# Patient Record
Sex: Male | Born: 1965
Health system: Southern US, Community
[De-identification: ages and names within clinical notes are randomized; demographics above are authoritative.]

## PROBLEM LIST (undated history)

## (undated) DIAGNOSIS — G4733 Obstructive sleep apnea (adult) (pediatric): Secondary | ICD-10-CM

## (undated) DIAGNOSIS — L738 Other specified follicular disorders: Secondary | ICD-10-CM

## (undated) DIAGNOSIS — G4730 Sleep apnea, unspecified: Secondary | ICD-10-CM

## (undated) DIAGNOSIS — G43909 Migraine, unspecified, not intractable, without status migrainosus: Secondary | ICD-10-CM

## (undated) DIAGNOSIS — I10 Essential (primary) hypertension: Secondary | ICD-10-CM

## (undated) HISTORY — DX: Other specified follicular disorders: L73.8

## (undated) HISTORY — DX: Essential (primary) hypertension: I10

## (undated) HISTORY — DX: Migraine, unspecified, not intractable, without status migrainosus: G43.909

## (undated) HISTORY — DX: Sleep apnea, unspecified: G47.30

## (undated) HISTORY — DX: Obstructive sleep apnea (adult) (pediatric): G47.33

---

## 1998-08-31 HISTORY — PX: OTHER SURGICAL HISTORY: SHX169

## 1998-09-25 ENCOUNTER — Inpatient Hospital Stay (HOSPITAL_COMMUNITY): Admission: RE | Admit: 1998-09-25 | Discharge: 1998-09-28 | Payer: Self-pay | Admitting: Oral Surgery

## 1998-09-26 ENCOUNTER — Encounter: Payer: Self-pay | Admitting: Oral Surgery

## 2007-03-28 ENCOUNTER — Emergency Department (HOSPITAL_COMMUNITY): Admission: EM | Admit: 2007-03-28 | Discharge: 2007-03-28 | Payer: Self-pay | Admitting: Emergency Medicine

## 2012-03-23 HISTORY — PX: FOOT SURGERY: SHX648

## 2013-02-12 ENCOUNTER — Ambulatory Visit (INDEPENDENT_AMBULATORY_CARE_PROVIDER_SITE_OTHER): Payer: BC Managed Care – PPO | Admitting: Podiatry

## 2013-02-12 ENCOUNTER — Ambulatory Visit (INDEPENDENT_AMBULATORY_CARE_PROVIDER_SITE_OTHER): Payer: BC Managed Care – PPO

## 2013-02-12 ENCOUNTER — Encounter: Payer: Self-pay | Admitting: Podiatry

## 2013-02-12 VITALS — BP 130/89 | HR 68 | Resp 16 | Ht 69.0 in | Wt 190.0 lb

## 2013-02-12 DIAGNOSIS — R52 Pain, unspecified: Secondary | ICD-10-CM

## 2013-02-12 DIAGNOSIS — S92309A Fracture of unspecified metatarsal bone(s), unspecified foot, initial encounter for closed fracture: Secondary | ICD-10-CM | POA: Insufficient documentation

## 2013-02-12 MED ORDER — OXYCODONE-ACETAMINOPHEN 10-325 MG PO TABS: ORAL_TABLET | ORAL | Status: DC

## 2013-02-12 NOTE — Progress Notes (Signed)
N HURT/SWELL L RIGHT FOOT THRU OUT D 1.5 WEEK O SUDDEN C WORSE A EVERYTHING T SOAK IN EPSON SALT, TIGHT SOCK,

## 2013-02-12 NOTE — Patient Instructions (Signed)
Wear cam walker at all times.

## 2013-02-13 NOTE — Progress Notes (Signed)
Lance Long presents today with a chief complaint of a painful swollen right foot times about week and a half. States that he dropped a Financial planner on his right foot a week and a half ago started swelling the next day has been painful ever since. Seems to be getting worse he says. He's been soaking in Epsom salts and wear a tight sock.  Objective: Pulses are palpable dorsalis pedis and tibial artery. He has overlying edema dorsal lateral aspect of the right foot with excoriations. No signs of infection. Tendo palpation to the base of the fourth and fifth metatarsals right. Radiographic evaluation does demonstrate a cortical interruption to the medial side of the fifth metatarsal right. It also demonstrates a dorsal edema.  Assessment: Fracture fourth fifth metatarsals left foot traumatic in nature.  Plan: I wrote a prescription for pain medication. I suggested he get into his cam boot which she has at home. I suggested he wear the boot as if it were a cast. I will followup with Lance Long in a few weeks. This time another set of x-rays will be performed.

## 2013-03-14 ENCOUNTER — Ambulatory Visit: Payer: BC Managed Care – PPO | Admitting: Podiatry

## 2013-03-19 ENCOUNTER — Ambulatory Visit: Payer: BC Managed Care – PPO | Admitting: Podiatry

## 2014-02-20 ENCOUNTER — Other Ambulatory Visit: Payer: Self-pay | Admitting: *Deleted

## 2014-02-20 DIAGNOSIS — R002 Palpitations: Secondary | ICD-10-CM

## 2014-02-26 ENCOUNTER — Encounter: Payer: Self-pay | Admitting: *Deleted

## 2014-02-26 ENCOUNTER — Ambulatory Visit (INDEPENDENT_AMBULATORY_CARE_PROVIDER_SITE_OTHER): Payer: Managed Care, Other (non HMO) | Admitting: Radiology

## 2014-02-26 DIAGNOSIS — R002 Palpitations: Secondary | ICD-10-CM

## 2014-02-26 NOTE — Progress Notes (Signed)
Patient ID: Lance Long, male   DOB: 04/08/1966, 48 y.o.   MRN: [id number] [facility] 48 hour holter monitor applied to patient.

## 2014-03-21 ENCOUNTER — Other Ambulatory Visit: Payer: Self-pay | Admitting: *Deleted

## 2014-03-21 ENCOUNTER — Encounter: Payer: Self-pay | Admitting: Cardiology

## 2014-03-21 ENCOUNTER — Ambulatory Visit (INDEPENDENT_AMBULATORY_CARE_PROVIDER_SITE_OTHER): Payer: Managed Care, Other (non HMO) | Admitting: Cardiology

## 2014-03-21 DIAGNOSIS — R079 Chest pain, unspecified: Secondary | ICD-10-CM

## 2014-03-21 DIAGNOSIS — R002 Palpitations: Secondary | ICD-10-CM

## 2014-03-21 NOTE — Progress Notes (Signed)
Exercise Treadmill Test  Pre-Exercise Testing Evaluation Rhythm: normal sinus  Rate: 71 bpm     Test  Exercise Tolerance Test Ordering MD: Dietrich PatesPaula Ross, MD  Interpreting MD: Robbie LisBrittainy Riane Rung, PA-C  Unique Test No: 1  Treadmill:  1  Indication for ETT: chest pain - rule out ischemia  Contraindication to ETT: No   Stress Modality: exercise - treadmill  Cardiac Imaging Performed: non   Protocol: standard Bruce - maximal  Max BP:  211/97  Max MPHR (bpm):  173 85% MPR (bpm):  147  MPHR obtained (bpm):  164 % MPHR obtained:  94%  Reached 85% MPHR (min:sec):  7:35 Total Exercise Time (min-sec):  10:00  Workload in METS:  11.7 Borg Scale: 15  Reason ETT Terminated:  desired heart rate attained    ST Segment Analysis At Rest: normal ST segments - no evidence of significant ST depression With Exercise: no evidence of significant ST depression  Other Information Arrhythmia:  No Angina during ETT:  absent (0) Quality of ETT:  diagnostic  ETT Interpretation:  normal - no evidence of ischemia by ST analysis  Comments: Dr. Tenny Crawoss to review EKGs.   Recommendations: Results pending.

## 2014-10-29 ENCOUNTER — Encounter: Payer: Self-pay | Admitting: Cardiology

## 2014-10-29 ENCOUNTER — Ambulatory Visit (INDEPENDENT_AMBULATORY_CARE_PROVIDER_SITE_OTHER): Payer: Managed Care, Other (non HMO) | Admitting: Cardiology

## 2014-10-29 VITALS — BP 120/80 | HR 69 | Ht 69.0 in | Wt 211.2 lb

## 2014-10-29 DIAGNOSIS — I10 Essential (primary) hypertension: Secondary | ICD-10-CM | POA: Diagnosis not present

## 2014-10-29 DIAGNOSIS — G4733 Obstructive sleep apnea (adult) (pediatric): Secondary | ICD-10-CM | POA: Diagnosis not present

## 2014-10-29 DIAGNOSIS — E669 Obesity, unspecified: Secondary | ICD-10-CM | POA: Diagnosis not present

## 2014-10-29 NOTE — Addendum Note (Signed)
Addended by: Gunnar FusiKEMP, KATHRYN A on: 10/29/2014 12:14 PM   Modules accepted: Orders

## 2014-10-29 NOTE — Patient Instructions (Signed)
Medication Instructions:  Your physician recommends that you continue on your current medications as directed. Please refer to the Current Medication list given to you today.   Labwork: None    Testing/Procedures: None  Follow-Up: Your physician wants you to follow-up in: 6 months with Dr. Mayford Knifeurner. You will receive a reminder letter in the mail two months in advance. If you don't receive a letter, please call our office to schedule the follow-up appointment.   Any Other Special Instructions Will Be Listed Below (If Applicable). [facility] will be in touch with you soon to get your new mask and supplies.

## 2014-10-29 NOTE — Progress Notes (Signed)
Cardiology Office Note   Date:  10/29/2014   ID:  Lance Long, DOB [birth year], MRN [id number]  PCP:  REDMON,NOELLE, PA-C    Chief Complaint  Patient presents with  . New Evaluation    sleep apnea      History of Present Illness: Lance Long is a 49 y.o. male who presents for evaluation of OSA.  He has a history of OSA dating back to 2010 but has not followed up.  He was started on CPAP at 11cm H2O. He has a history of HTN as well.  He tolerates his CPAP device but over the past few months he  has stopped using it due to needing a new mask and supplies.   He tolerates the mask but says that he needs a new mask.  He has used the full face mask in the past.   He feels the pressure is adequate.  He feels rested in the am and has no daytime sleepiness.  He does not snore.  He has no mouth dryness or congestion. He has not had any supplies in a while and needs some new ones.      Past Medical History  Diagnosis Date  . Hypertension   . OSA (obstructive sleep apnea)   . Migraine   . Folliculitis barbae     Past Surgical History  Procedure Laterality Date  . Tmj jaw    . Foot surgery Left 11.22.13     Current Outpatient Prescriptions  Medication Sig Dispense Refill  . fexofenadine-pseudoephedrine (ALLEGRA-D 24) 180-240 MG per 24 hr tablet Take 1 tablet by mouth as needed.    . hydrochlorothiazide (HYDRODIURIL) 25 MG tablet Take 25 mg by mouth daily.    . Multiple Vitamin (MULTI VITAMIN DAILY) TABS Take by mouth daily.    . SUMAtriptan (IMITREX) 50 MG tablet Take 50 mg by mouth every 2 (two) hours as needed for migraine. May repeat in 2 hours if headache persists or recurs.     No current facility-administered medications for this visit.    Allergies:   Oxycodone-acetaminophen    Social History:  The patient  reports that he has never smoked. He has never used smokeless tobacco. He reports that he drinks alcohol. He reports that he does not use  illicit drugs.   Family History:  The patient's family history includes Fibromyalgia in his mother; Heart attack in his maternal grandmother; Hypertension in his mother; Liver cancer in his father; Liver disease in his maternal grandfather.    ROS:  Please see the history of present illness.   Otherwise, review of systems are positive for none.   All other systems are reviewed and negative.   PHYSICAL EXAM: VS:  BP 120/80 mmHg  Pulse 69  Ht  (1.753 m)  Wt 211 lb 3.2 oz (95.8 kg)  BMI 31.17 kg/m2  SpO2 98% , BMI Body mass index is 31.17 kg/(m^2). GEN: Well nourished, well developed, in no acute distress HEENT: normal Neck: no JVD, carotid bruits, or masses Cardiac: RRR; no murmurs, rubs, or gallops,no edema  Respiratory:  clear to auscultation bilaterally, normal work of breathing GI: soft, nontender, nondistended, + BS MS: no deformity or atrophy Skin: warm and dry, no rash Neuro:  Strength and sensation are intact Psych: euthymic mood, full affect   EKG:  EKG is not ordered today.    Recent Labs: No results  found for requested labs within last 365 days.    Lipid Panel No results found for: CHOL, TRIG, HDL, CHOLHDL, VLDL, LDLCALC, LDLDIRECT    Wt Readings from Last 3 Encounters:  10/29/14 211 lb 3.2 oz (95.8 kg)  02/12/13 190 lb (86.183 kg)        ASSESSMENT AND PLAN:  1.  OSA on CPAP and tolerating well but needs new supplies which I will order.  I have also instructed the patient on proper sleep hygiene, avoidance of sleeping in the supine position and avoidance of alcohol within 4 hours of bedtime.  The patient was also instructed to avoid driving if sleepy.   2.  HTN - controlled on HCTZ 3.  Obesity - he has started back exercising with cardio and weight at home.    Current medicines are reviewed at length with the patient today.  The patient does not have concerns regarding medicines.  The following changes have been made:  no change  Labs/ tests  ordered today: See above Assessment and Plan No orders of the defined types were placed in this encounter.     Disposition:   FU with me in 6 months  Signed, Quintella ReichertURNER,Damiya Sandefur R, MD  10/29/2014 11:35 AM    [facility] [address], [place], [place]  [address] Phone: [phone]; Fax: [phone]

## 2015-10-20 ENCOUNTER — Ambulatory Visit: Payer: Managed Care, Other (non HMO) | Admitting: Family

## 2017-11-21 ENCOUNTER — Ambulatory Visit: Payer: Managed Care, Other (non HMO) | Admitting: Adult Health

## 2017-11-21 NOTE — Progress Notes (Deleted)
   Subjective:    Patient ID: Lance Long, male    DOB: 12/14/1965, 52 y.o.   MRN: [id number]  HPI:  Mr. Lance Long is here to establish as a new pt.  He is a pleasant 52 year old male. PMH:  HTN, Migraine, Obesity,    Patient Care Team    Relationship Specialty Notifications Start End  Milus Heightedmon, Noelle, [place] PCP - General Nurse Practitioner  02/12/13     Patient Active Problem List   Diagnosis Date Noted  . OSA (obstructive sleep apnea) 10/29/2014  . Benign essential HTN 10/29/2014  . Obesity (BMI 30-39.9) 10/29/2014  . Closed fracture of metatarsal bone(s) 02/12/2013     Past Medical History:  Diagnosis Date  . Folliculitis barbae   . Hypertension   . Migraine   . OSA (obstructive sleep apnea)      Past Surgical History:  Procedure Laterality Date  . FOOT SURGERY Left 11.22.13  . TMJ JAW       Family History  Problem Relation Age of Onset  . Hypertension Mother   . Fibromyalgia Mother   . Liver cancer Father   . Heart attack Maternal Grandmother   . Liver disease Maternal Grandfather      Social History   Substance and Sexual Activity  Drug Use No     Social History   Substance and Sexual Activity  Alcohol Use Yes   Comment: OCCASIONALLY     Social History   Tobacco Use  Smoking Status Never Smoker  Smokeless Tobacco Never Used     Outpatient Encounter Medications as of 11/21/2017  Medication Sig  . fexofenadine-pseudoephedrine (ALLEGRA-D 24) 180-240 MG per 24 hr tablet Take 1 tablet by mouth as needed.  . hydrochlorothiazide (HYDRODIURIL) 25 MG tablet Take 25 mg by mouth daily.  . Multiple Vitamin (MULTI VITAMIN DAILY) TABS Take by mouth daily.  . SUMAtriptan (IMITREX) 50 MG tablet Take 50 mg by mouth every 2 (two) hours as needed for migraine. May repeat in 2 hours if headache persists or recurs.   No facility-administered encounter medications on file as of 11/21/2017.     Allergies: Oxycodone-acetaminophen  There is no height or  weight on file to calculate BMI.  There were no vitals taken for this visit.   Review of Systems     Objective:   Physical Exam        Assessment & Plan:

## 2017-12-19 ENCOUNTER — Ambulatory Visit (INDEPENDENT_AMBULATORY_CARE_PROVIDER_SITE_OTHER): Payer: Managed Care, Other (non HMO) | Admitting: Adult Health

## 2017-12-19 ENCOUNTER — Encounter: Payer: Self-pay | Admitting: Adult Health

## 2017-12-19 VITALS — BP 143/83 | HR 65 | Ht 69.0 in | Wt 194.6 lb

## 2017-12-19 DIAGNOSIS — Z23 Encounter for immunization: Secondary | ICD-10-CM

## 2017-12-19 DIAGNOSIS — Z1159 Encounter for screening for other viral diseases: Secondary | ICD-10-CM | POA: Diagnosis not present

## 2017-12-19 DIAGNOSIS — Z833 Family history of diabetes mellitus: Secondary | ICD-10-CM | POA: Diagnosis not present

## 2017-12-19 DIAGNOSIS — G43901 Migraine, unspecified, not intractable, with status migrainosus: Secondary | ICD-10-CM

## 2017-12-19 DIAGNOSIS — G4733 Obstructive sleep apnea (adult) (pediatric): Secondary | ICD-10-CM

## 2017-12-19 DIAGNOSIS — G43909 Migraine, unspecified, not intractable, without status migrainosus: Secondary | ICD-10-CM | POA: Insufficient documentation

## 2017-12-19 DIAGNOSIS — Z114 Encounter for screening for human immunodeficiency virus [HIV]: Secondary | ICD-10-CM

## 2017-12-19 DIAGNOSIS — Z0000 Encounter for general adult medical examination without abnormal findings: Secondary | ICD-10-CM | POA: Diagnosis not present

## 2017-12-19 DIAGNOSIS — G43101 Migraine with aura, not intractable, with status migrainosus: Secondary | ICD-10-CM

## 2017-12-19 DIAGNOSIS — Z1211 Encounter for screening for malignant neoplasm of colon: Secondary | ICD-10-CM

## 2017-12-19 DIAGNOSIS — I10 Essential (primary) hypertension: Secondary | ICD-10-CM

## 2017-12-19 DIAGNOSIS — G4730 Sleep apnea, unspecified: Secondary | ICD-10-CM

## 2017-12-19 MED ORDER — HYDROCHLOROTHIAZIDE 25 MG PO TABS: 25.0000 mg | ORAL_TABLET | Freq: Every day | ORAL | 3 refills | Status: DC

## 2017-12-19 MED ORDER — SUMATRIPTAN SUCCINATE 50 MG PO TABS: 50.0000 mg | ORAL_TABLET | ORAL | 2 refills | Status: DC | PRN

## 2017-12-19 NOTE — Assessment & Plan Note (Signed)
He has not used CPAP >3 years due to machine malfunctioning Referral to Pulmonology placed

## 2017-12-19 NOTE — Assessment & Plan Note (Addendum)
BP above goal  143/83, HR 62 Currently on HCTZ 25mg  QD If elevated at f/u with consider adding CCB

## 2017-12-19 NOTE — Assessment & Plan Note (Signed)
2 migraines/week Will experience aura and light/sound sensitivity Referral to Neurology placed

## 2017-12-19 NOTE — Progress Notes (Addendum)
Subjective:    Patient ID: Lance Long, male    DOB: 09/16/1965, 52 y.o.   MRN: [id number]  HPI :  [name] is here to establish as a new pt.  He is a pleasant 52 year old male. PMH:  HTN, Migraines with aura He has been on HCTZ 25mg  QD for >15 years He was initially dx'd with migraine at age 52 (11 years ago).  He would experience  1-3 migraines/month and then they eventually reduced to once/month. The last 4 weeks he reports HAs have increased to 2/week- will last all day and cause nausea w/o vomiting. He reports pain will "be on the top of my head", described as aching and rates 8/10. He previously used Imitrex, however has been out "for quite awhile" He has been using OTC NSAIDs with only minimal sx relief. He estimates to drink >gallon water/day and eats only one small meal/day His wife of 27 years passed away April 2019 He has one 52 yr old daughter "Lance Long" that is living with him He is not in therapy, declined CBT referral He reports strong support system of family/friends and denies acute depression He denies tobacco use and seldom drinks ETOH He works three jobs, averaging >95 hrs/week He exercises- cardio at least once week for 30 minutes  Patient Care Team    Relationship Specialty Notifications Start End  Jru Pense, Jinny BlossomKaty D, NP PCP - General Family Medicine  12/19/17     Patient Active Problem List   Diagnosis Date Noted  . Acute pain of right knee 11/14/2018  . Leg numbness 06/13/2018  . Leg weakness, bilateral 06/13/2018  . Erectile dysfunction 02/26/2018  . Chronic migraine 02/06/2018  . Obstructive sleep apnea 02/06/2018  . Migraine 12/19/2017  . Healthcare maintenance 12/19/2017  . OSA (obstructive sleep apnea) 10/29/2014  . Benign essential HTN 10/29/2014  . Obesity (BMI 30-39.9) 10/29/2014  . Closed fracture of metatarsal bone(s) 02/12/2013     Past Medical History:  Diagnosis Date  . Folliculitis barbae   . Hypertension   . Migraine   . OSA  (obstructive sleep apnea)   . Sleep apnea    does not use c-pap machine     Past Surgical History:  Procedure Laterality Date  . FOOT SURGERY Left 11.22.13  . TMJ JAW  08/1998     Family History  Problem Relation Age of Onset  . Hypertension Mother   . Fibromyalgia Mother   . Diabetes Mother   . Liver cancer Father   . Heart attack Maternal Grandmother   . Diabetes Maternal Grandmother   . Hypertension Maternal Grandmother   . Stroke Maternal Grandmother   . Liver disease Maternal Grandfather   . Healthy Sister   . Healthy Brother   . Colon cancer Neg Hx   . Colon polyps Neg Hx   . Esophageal cancer Neg Hx   . Rectal cancer Neg Hx   . Stomach cancer Neg Hx      Social History   Substance and Sexual Activity  Drug Use No     Social History   Substance and Sexual Activity  Alcohol Use Yes  . Alcohol/week: 2.0 standard drinks  . Types: 2 Glasses of wine per week   Comment: occasional     Social History   Tobacco Use  Smoking Status Never Smoker  Smokeless Tobacco Never Used     Outpatient Encounter Medications as of 12/19/2017  Medication Sig  . Multiple Vitamin (MULTI VITAMIN DAILY) TABS  Take by mouth daily.  . [DISCONTINUED] hydrochlorothiazide (HYDRODIURIL) 25 MG tablet Take 25 mg by mouth daily.  . [DISCONTINUED] hydrochlorothiazide (HYDRODIURIL) 25 MG tablet Take 1 tablet (25 mg total) by mouth daily.  . [DISCONTINUED] fexofenadine-pseudoephedrine (ALLEGRA-D 24) 180-240 MG per 24 hr tablet Take 1 tablet by mouth as needed.  . [DISCONTINUED] SUMAtriptan (IMITREX) 50 MG tablet Take 50 mg by mouth every 2 (two) hours as needed for migraine. May repeat in 2 hours if headache persists or recurs.  . [DISCONTINUED] SUMAtriptan (IMITREX) 50 MG tablet Take 1 tablet (50 mg total) by mouth every 2 (two) hours as needed for migraine. May repeat in 2 hours if headache persists or recurs.   No facility-administered encounter medications on file as of 12/19/2017.      Allergies: Patient has no known allergies.  Body mass index is 28.74 kg/m.  Blood pressure (!) 143/83, pulse 65, height 5\' 9"  (1.753 m), weight 194 lb 9.6 oz (88.3 kg), SpO2 100 %.  Review of Systems  Constitutional: Positive for fatigue. Negative for activity change, appetite change, chills, diaphoresis, fever and unexpected weight change.  Eyes: Negative for visual disturbance.  Respiratory: Negative for cough, chest tightness, shortness of breath, wheezing and stridor.   Cardiovascular: Negative for chest pain, palpitations and leg swelling.  Gastrointestinal: Positive for nausea. Negative for abdominal distention, abdominal pain, blood in stool, constipation, diarrhea and vomiting.  Genitourinary: Negative for difficulty urinating and flank pain.  Musculoskeletal: Negative for arthralgias, back pain, gait problem, joint swelling, myalgias, neck pain and neck stiffness.  Skin: Negative for color change, pallor, rash and wound.  Neurological: Positive for headaches. Negative for dizziness.  Hematological: Does not bruise/bleed easily.  Psychiatric/Behavioral: Positive for sleep disturbance. Negative for confusion, decreased concentration, dysphoric mood, hallucinations, self-injury and suicidal ideas. The patient is not nervous/anxious and is not hyperactive.        Objective:   Physical Exam  Constitutional: He is oriented to person, place, and time. He appears well-developed and well-nourished. No distress.  HENT:  Head: Normocephalic and atraumatic.  Right Ear: External ear normal.  Nose: Nose normal.  Mouth/Throat: Oropharynx is clear and moist.  Cardiovascular: Normal rate, regular rhythm, normal heart sounds and intact distal pulses.  No murmur heard. Pulmonary/Chest: Effort normal and breath sounds normal. No stridor. No respiratory distress. He has no wheezes. He has no rales. He exhibits no tenderness.  Neurological: He is alert and oriented to person, place, and  time.  Skin: Skin is warm and dry. Capillary refill takes less than 2 seconds. No rash noted. He is not diaphoretic. No erythema. No pallor.  Psychiatric: He has a normal mood and affect. His behavior is normal. Judgment and thought content normal.  Nursing note and vitals reviewed.     Assessment & Plan:   1. Healthcare maintenance   2. Family history of diabetes mellitus in mother   3. Screening for HIV (human immunodeficiency virus)   4. Encounter for hepatitis C screening test for low risk patient   5. Need for zoster vaccination   6. Screening for colon cancer   7. Sleep apnea, unspecified type   8. Migraine with aura and with status migrainosus, not intractable   9. Benign essential HTN   10. Migraine with status migrainosus, not intractable, unspecified migraine type   11. OSA (obstructive sleep apnea)     Benign essential HTN BP above goal  143/83, HR 62 Currently on HCTZ 25mg  QD If elevated at f/u with consider  adding CCB  Migraine 2 migraines/week Will experience aura and light/sound sensitivity Referral to Neurology placed  OSA (obstructive sleep apnea) He has not used CPAP >3 years due to machine malfunctioning Referral to Pulmonology placed  Healthcare maintenance Continue all medications as directed. Rx refills sent in. Referral to Pulmonology placed, re: Sleep Study Referral to Neurology placed, re: migraine with aura- increasing in frequency and intensity. Follow-up in 3 months for complete physical with fasting labs    FOLLOW-UP:  Return in about 3 months (around 03/21/2018) for CPE, Fasting Labs.

## 2017-12-19 NOTE — Assessment & Plan Note (Signed)
Continue all medications as directed. Rx refills sent in. Referral to Pulmonology placed, re: Sleep Study Referral to Neurology placed, re: migraine with aura- increasing in frequency and intensity. Follow-up in 3 months for complete physical with fasting labs

## 2017-12-19 NOTE — Patient Instructions (Addendum)
Mediterranean Diet A Mediterranean diet refers to food and lifestyle choices that are based on the traditions of countries located on the [place]. This way of eating has been shown to help prevent certain conditions and improve outcomes for people who have chronic diseases, like kidney disease and heart disease. What are tips for following this plan? Lifestyle  Cook and eat meals together with your family, when possible.  Drink enough fluid to keep your urine clear or pale yellow.  Be physically active every day. This includes: ? Aerobic exercise like running or swimming. ? Leisure activities like gardening, walking, or housework.  Get 7-8 hours of sleep each night.  If recommended by your health care provider, drink red wine in moderation. This means 1 glass a day for nonpregnant women and 2 glasses a day for men. A glass of wine equals 5 oz (150 mL). Reading food labels  Check the serving size of packaged foods. For foods such as rice and pasta, the serving size refers to the amount of cooked product, not dry.  Check the total fat in packaged foods. Avoid foods that have saturated fat or trans fats.  Check the ingredients list for added sugars, such as corn syrup. Shopping  At the grocery store, buy most of your food from the areas near the walls of the store. This includes: ? Fresh fruits and vegetables (produce). ? Grains, beans, nuts, and seeds. Some of these may be available in unpackaged forms or large amounts (in bulk). ? Fresh seafood. ? Poultry and eggs. ? Low-fat dairy products.  Buy whole ingredients instead of prepackaged foods.  Buy fresh fruits and vegetables in-season from local farmers markets.  Buy frozen fruits and vegetables in resealable bags.  If you do not have access to quality fresh seafood, buy precooked frozen shrimp or canned fish, such as tuna, salmon, or sardines.  Buy small amounts of raw or cooked vegetables, salads, or olives from the  deli or salad bar at your store.  Stock your pantry so you always have certain foods on hand, such as olive oil, canned tuna, canned tomatoes, rice, pasta, and beans. Cooking  Cook foods with extra-virgin olive oil instead of using butter or other vegetable oils.  Have meat as a side dish, and have vegetables or grains as your main dish. This means having meat in small portions or adding small amounts of meat to foods like pasta or stew.  Use beans or vegetables instead of meat in common dishes like chili or lasagna.  Experiment with different cooking methods. Try roasting or broiling vegetables instead of steaming or sauteing them.  Add frozen vegetables to soups, stews, pasta, or rice.  Add nuts or seeds for added healthy fat at each meal. You can add these to yogurt, salads, or vegetable dishes.  Marinate fish or vegetables using olive oil, lemon juice, garlic, and fresh herbs. Meal planning  Plan to eat 1 vegetarian meal one day each week. Try to work up to 2 vegetarian meals, if possible.  Eat seafood 2 or more times a week.  Have healthy snacks readily available, such as: ? Vegetable sticks with hummus. ? Greek yogurt. ? Fruit and nut trail mix.  Eat balanced meals throughout the week. This includes: ? Fruit: 2-3 servings a day ? Vegetables: 4-5 servings a day ? Low-fat dairy: 2 servings a day ? Fish, poultry, or lean meat: 1 serving a day ? Beans and legumes: 2 or more servings a week ? Nuts   and seeds: 1-2 servings a day ? Whole grains: 6-8 servings a day ? Extra-virgin olive oil: 3-4 servings a day  Limit red meat and sweets to only a few servings a month What are my food choices?  Mediterranean diet ? Recommended ? Grains: Whole-grain pasta. Brown rice. Bulgar wheat. Polenta. Couscous. Whole-wheat bread. [place]. ? Vegetables: Artichokes. Beets. Broccoli. Cabbage. Carrots. Eggplant. Green beans. Chard. Kale. Spinach. Onions. Leeks. Peas. Squash.  Tomatoes. Peppers. Radishes. ? Fruits: Apples. Apricots. Avocado. Berries. Bananas. Cherries. Dates. Figs. Grapes. Lemons. Melon. Oranges. Peaches. Plums. Pomegranate. ? Meats and other protein foods: Beans. Almonds. Sunflower seeds. Pine nuts. Peanuts. [place]. Salmon. Scallops. Shrimp. [place]. Tilapia. Clams. Oysters. Eggs. ? Dairy: Low-fat milk. Cheese. Greek yogurt. ? Beverages: Water. Red wine. Herbal tea. ? Fats and oils: Extra virgin olive oil. Avocado oil. Grape seed oil. ? Sweets and desserts: [place] yogurt with honey. Baked apples. Poached pears. Trail mix. ? Seasoning and other foods: Basil. Cilantro. Coriander. Cumin. Mint. Parsley. Sage. Rosemary. Tarragon. Garlic. Oregano. Thyme. Pepper. Balsalmic vinegar. Tahini. Hummus. Tomato sauce. Olives. Mushrooms. ? Limit these ? Grains: Prepackaged pasta or rice dishes. Prepackaged cereal with added sugar. ? Vegetables: Deep fried potatoes (french fries). ? Fruits: Fruit canned in syrup. ? Meats and other protein foods: Beef. Pork. Lamb. Poultry with skin. Hot dogs. [place] Salines. ? Dairy: Ice cream. Sour cream. Whole milk. ? Beverages: Juice. Sugar-sweetened soft drinks. Beer. Liquor and spirits. ? Fats and oils: Butter. Canola oil. Vegetable oil. Beef fat (tallow). Lard. ? Sweets and desserts: Cookies. Cakes. Pies. Candy. ? Seasoning and other foods: Mayonnaise. Premade sauces and marinades. ? The items listed may not be a complete list. Talk with your dietitian about what dietary choices are right for you. Summary  The Mediterranean diet includes both food and lifestyle choices.  Eat a variety of fresh fruits and vegetables, beans, nuts, seeds, and whole grains.  Limit the amount of red meat and sweets that you eat.  Talk with your health care provider about whether it is safe for you to drink red wine in moderation. This means 1 glass a day for nonpregnant women and 2 glasses a day for men. A glass of wine equals 5 oz (150 mL). This information  is not intended to replace advice given to you by your health care provider. Make sure you discuss any questions you have with your health care provider. Document Released: 12/10/2015 Document Revised: 01/12/2016 Document Reviewed: 12/10/2015 Elsevier Interactive Patient Education  2018 [employer].   Hypertension Hypertension, commonly called high blood pressure, is when the force of blood pumping through the arteries is too strong. The arteries are the blood vessels that carry blood from the heart throughout the body. Hypertension forces the heart to work harder to pump blood and may cause arteries to become narrow or stiff. Having untreated or uncontrolled hypertension can cause heart attacks, strokes, kidney disease, and other problems. A blood pressure reading consists of a higher number over a lower number. Ideally, your blood pressure should be below 120/80. The first ("top") number is called the systolic pressure. It is a measure of the pressure in your arteries as your heart beats. The second ("bottom") number is called the diastolic pressure. It is a measure of the pressure in your arteries as the heart relaxes. What are the causes? The cause of this condition is not known. What increases the risk? Some risk factors for high blood pressure are under your control. Others are not. Factors you can  change  Smoking.  Having type 2 diabetes mellitus, high cholesterol, or both.  Not getting enough exercise or physical activity.  Being overweight.  Having too much fat, sugar, calories, or salt (sodium) in your diet.  Drinking too much alcohol. Factors that are difficult or impossible to change  Having chronic kidney disease.  Having a family history of high blood pressure.  Age. Risk increases with age.  Race. You may be at higher risk if you are African-American.  Gender. Men are at higher risk than women before age 70. After age 39, women are at higher risk than  men.  Having obstructive sleep apnea.  Stress. What are the signs or symptoms? Extremely high blood pressure (hypertensive crisis) may cause:  Headache.  Anxiety.  Shortness of breath.  Nosebleed.  Nausea and vomiting.  Severe chest pain.  Jerky movements you cannot control (seizures).  How is this diagnosed? This condition is diagnosed by measuring your blood pressure while you are seated, with your arm resting on a surface. The cuff of the blood pressure monitor will be placed directly against the skin of your upper arm at the level of your heart. It should be measured at least twice using the same arm. Certain conditions can cause a difference in blood pressure between your right and left arms. Certain factors can cause blood pressure readings to be lower or higher than normal (elevated) for a short period of time:  When your blood pressure is higher when you are in a health care provider's office than when you are at home, this is called white coat hypertension. Most people with this condition do not need medicines.  When your blood pressure is higher at home than when you are in a health care provider's office, this is called masked hypertension. Most people with this condition may need medicines to control blood pressure.  If you have a high blood pressure reading during one visit or you have normal blood pressure with other risk factors:  You may be asked to return on a different day to have your blood pressure checked again.  You may be asked to monitor your blood pressure at home for 1 week or longer.  If you are diagnosed with hypertension, you may have other blood or imaging tests to help your health care provider understand your overall risk for other conditions. How is this treated? This condition is treated by making healthy lifestyle changes, such as eating healthy foods, exercising more, and reducing your alcohol intake. Your health care provider may prescribe  medicine if lifestyle changes are not enough to get your blood pressure under control, and if:  Your systolic blood pressure is above 130.  Your diastolic blood pressure is above 80.  Your personal target blood pressure may vary depending on your medical conditions, your age, and other factors. Follow these instructions at home: Eating and drinking  Eat a diet that is high in fiber and potassium, and low in sodium, added sugar, and fat. An example eating plan is called the DASH (Dietary Approaches to Stop Hypertension) diet. To eat this way: ? Eat plenty of fresh fruits and vegetables. Try to fill half of your plate at each meal with fruits and vegetables. ? Eat whole grains, such as whole wheat pasta, brown rice, or whole grain bread. Fill about one quarter of your plate with whole grains. ? Eat or drink low-fat dairy products, such as skim milk or low-fat yogurt. ? Avoid fatty cuts of meat, processed  or cured meats, and poultry with skin. Fill about one quarter of your plate with lean proteins, such as fish, chicken without skin, beans, eggs, and tofu. ? Avoid premade and processed foods. These tend to be higher in sodium, added sugar, and fat.  Reduce your daily sodium intake. Most people with hypertension should eat less than 1,500 mg of sodium a day.  Limit alcohol intake to no more than 1 drink a day for nonpregnant women and 2 drinks a day for men. One drink equals 12 oz of beer, 5 oz of wine, or 1 oz of hard liquor. Lifestyle  Work with your health care provider to maintain a healthy body weight or to lose weight. Ask what an ideal weight is for you.  Get at least 30 minutes of exercise that causes your heart to beat faster (aerobic exercise) most days of the week. Activities may include walking, swimming, or biking.  Include exercise to strengthen your muscles (resistance exercise), such as pilates or lifting weights, as part of your weekly exercise routine. Try to do these types  of exercises for 30 minutes at least 3 days a week.  Do not use any products that contain nicotine or tobacco, such as cigarettes and e-cigarettes. If you need help quitting, ask your health care provider.  Monitor your blood pressure at home as told by your health care provider.  Keep all follow-up visits as told by your health care provider. This is important. Medicines  Take over-the-counter and prescription medicines only as told by your health care provider. Follow directions carefully. Blood pressure medicines must be taken as prescribed.  Do not skip doses of blood pressure medicine. Doing this puts you at risk for problems and can make the medicine less effective.  Ask your health care provider about side effects or reactions to medicines that you should watch for. Contact a health care provider if:  You think you are having a reaction to a medicine you are taking.  You have headaches that keep coming back (recurring).  You feel dizzy.  You have swelling in your ankles.  You have trouble with your vision. Get help right away if:  You develop a severe headache or confusion.  You have unusual weakness or numbness.  You feel faint.  You have severe pain in your chest or abdomen.  You vomit repeatedly.  You have trouble breathing. Summary  Hypertension is when the force of blood pumping through your arteries is too strong. If this condition is not controlled, it may put you at risk for serious complications.  Your personal target blood pressure may vary depending on your medical conditions, your age, and other factors. For most people, a normal blood pressure is less than 120/80.  Hypertension is treated with lifestyle changes, medicines, or a combination of both. Lifestyle changes include weight loss, eating a healthy, low-sodium diet, exercising more, and limiting alcohol. This information is not intended to replace advice given to you by your health care provider.  Make sure you discuss any questions you have with your health care provider. Document Released: 04/18/2005 Document Revised: 03/16/2016 Document Reviewed: 03/16/2016 Elsevier Interactive Patient Education  2018 [employer]  Continue all medications as directed. Rx refills sent in. Referral to Pulmonology placed, re: Sleep Study Referral to Neurology placed, re: migraine with aura- increasing in frequency and intensity. Follow-up in 3 months for complete physical with fasting labs. WELCOME TO THE PRACTICE!

## 2017-12-21 ENCOUNTER — Encounter: Payer: Self-pay | Admitting: Gastroenterology

## 2017-12-25 ENCOUNTER — Telehealth: Payer: Self-pay | Admitting: *Deleted

## 2017-12-25 ENCOUNTER — Ambulatory Visit: Payer: Self-pay | Admitting: Neurology

## 2017-12-25 ENCOUNTER — Encounter: Payer: Self-pay | Admitting: Neurology

## 2017-12-25 NOTE — Telephone Encounter (Signed)
No showed new patient appointment. 

## 2017-12-28 DIAGNOSIS — Z0289 Encounter for other administrative examinations: Secondary | ICD-10-CM

## 2018-01-10 ENCOUNTER — Institutional Professional Consult (permissible substitution): Payer: Managed Care, Other (non HMO) | Admitting: Internal Medicine

## 2018-01-10 ENCOUNTER — Ambulatory Visit (INDEPENDENT_AMBULATORY_CARE_PROVIDER_SITE_OTHER): Payer: Managed Care, Other (non HMO) | Admitting: Pulmonary Disease

## 2018-01-10 ENCOUNTER — Encounter: Payer: Self-pay | Admitting: Pulmonary Disease

## 2018-01-10 VITALS — BP 124/80 | HR 77 | Ht 70.0 in | Wt 200.0 lb

## 2018-01-10 DIAGNOSIS — G4733 Obstructive sleep apnea (adult) (pediatric): Secondary | ICD-10-CM | POA: Diagnosis not present

## 2018-01-10 NOTE — Progress Notes (Signed)
Lance Long    [id number]    [birth year]  Primary Care Physician:Danford, Jinny Blossom, NP  Referring Physician: Julaine Fusi, NP [address] [place], [place] [address]  Chief complaint:  Daytime sleepiness, snoring with a history of obstructive sleep apnea  HPI:  Diagnosed with obstructive sleep apnea about 10 to 15 years ago Was using CPAP up until about 3 years ago Some recurrence of symptoms that he had before There is daytime sleepiness Nonrestorative sleep History of loud snoring Usually goes to bed about 12 AM, wakes up at 330 to go to work He will get another few hours of sleep between 1030 and 230 Denies chronic headaches Denies memory issues No issues with his concentration  Denies any respiratory complaints at present  Mother has sleep apnea  Pets: dog Occupation: No pertinent occupational history Exposures: No significant exposures Smoking history: Never smoker No pertinent travel history  Outpatient Encounter Medications as of 01/10/2018  Medication Sig  . hydrochlorothiazide (HYDRODIURIL) 25 MG tablet Take 1 tablet (25 mg total) by mouth daily.  . Multiple Vitamin (MULTI VITAMIN DAILY) TABS Take by mouth daily.  . SUMAtriptan (IMITREX) 50 MG tablet Take 1 tablet (50 mg total) by mouth every 2 (two) hours as needed for migraine. May repeat in 2 hours if headache persists or recurs.   No facility-administered encounter medications on file as of 01/10/2018.     Allergies as of 01/10/2018 - Review Complete 01/10/2018  Allergen Reaction Noted  . Oxycodone-acetaminophen Itching 10/28/2014    Past Medical History:  Diagnosis Date  . Folliculitis barbae   . Hypertension   . Migraine   . OSA (obstructive sleep apnea)     Past Surgical History:  Procedure Laterality Date  . FOOT SURGERY Left 11.22.13  . TMJ JAW      Family History  Problem Relation Age of Onset  . Hypertension Mother   . Fibromyalgia Mother   . Diabetes Mother   .  Liver cancer Father   . Heart attack Maternal Grandmother   . Diabetes Maternal Grandmother   . Hypertension Maternal Grandmother   . Stroke Maternal Grandmother   . Liver disease Maternal Grandfather     Social History   Socioeconomic History  . Marital status: Married    Spouse name: Not on file  . Number of children: Not on file  . Years of education: Not on file  . Highest education level: Not on file  Occupational History  . Not on file  Social Needs  . Financial resource strain: Not on file  . Food insecurity:    Worry: Not on file    Inability: Not on file  . Transportation needs:    Medical: Not on file    Non-medical: Not on file  Tobacco Use  . Smoking status: Never Smoker  . Smokeless tobacco: Never Used  Substance and Sexual Activity  . Alcohol use: Yes    Alcohol/week: 2.0 standard drinks    Types: 2 Glasses of wine per week  . Drug use: No  . Sexual activity: Not Currently  Lifestyle  . Physical activity:    Days per week: Not on file    Minutes per session: Not on file  . Stress: Not on file  Relationships  . Social connections:    Talks on phone: Not on file    Gets together: Not on file    Attends religious service: Not on file  Active member of club or organization: Not on file    Attends meetings of clubs or organizations: Not on file    Relationship status: Not on file  . Intimate partner violence:    Fear of current or ex partner: Not on file    Emotionally abused: Not on file    Physically abused: Not on file    Forced sexual activity: Not on file  Other Topics Concern  . Not on file  Social History Narrative  . Not on file    Review of Systems  Constitutional: Positive for fatigue.  Respiratory: Positive for apnea.   Cardiovascular: Negative.   Gastrointestinal: Negative.   Genitourinary: Negative.   Skin: Negative.   Psychiatric/Behavioral: Positive for sleep disturbance.    There were no vitals filed for this  visit.   Physical Exam  Constitutional: He is oriented to person, place, and time. He appears well-developed and well-nourished.  HENT:  Mallampati 4  Eyes: Pupils are equal, round, and reactive to light. Conjunctivae and EOM are normal. Right eye exhibits no discharge.  Neck: Normal range of motion. Neck supple. No tracheal deviation present. No thyromegaly present.  Cardiovascular: Normal rate and regular rhythm.  Pulmonary/Chest: Effort normal and breath sounds normal. No respiratory distress.  Abdominal: Soft. Bowel sounds are normal. He exhibits no distension. There is no tenderness.  Musculoskeletal: Normal range of motion. He exhibits deformity. He exhibits no edema.  Neurological: He is alert and oriented to person, place, and time. He has normal reflexes. Coordination normal.  Skin: Skin is warm and dry. No rash noted. No erythema.  Psychiatric: He has a normal mood and affect.    Data Reviewed: Previous studies not available  Assessment:  High probability of significant sleep disordered breathing Excessive daytime sleepiness Loud snoring  Plan/Recommendations:  We will schedule patient for home sleep study  Possibility of treatment with auto titrating CPAP therapy  Pathophysiology of sleep disordered breathing discussed  Treatment options of sleep disordered breathing discussed  Importance of weight loss and regular exercises discussed  I will see him back in the office a couple of months after initiating treatment  Virl Diamond MD [facility] 01/10/2018, 2:58 PM  CC: Julaine Fusi, NP

## 2018-01-10 NOTE — Addendum Note (Signed)
Addended by: Sylvester Harder on: 01/10/2018 03:33 PM   Modules accepted: Orders

## 2018-01-10 NOTE — Patient Instructions (Signed)
History of obstructive sleep apnea  Has not been on CPAP the last 3 years Resurgence of symptoms of obstructive sleep apnea  We will order a home sleep study  I will see her back in about 6 to 8 weeks following initiation of treatment  Suggest regular exercises  Call if any significant concerns

## 2018-01-23 ENCOUNTER — Ambulatory Visit (AMBULATORY_SURGERY_CENTER): Payer: Self-pay

## 2018-01-23 VITALS — Ht 70.0 in | Wt 200.0 lb

## 2018-01-23 DIAGNOSIS — Z1211 Encounter for screening for malignant neoplasm of colon: Secondary | ICD-10-CM

## 2018-01-23 MED ORDER — NA SULFATE-K SULFATE-MG SULF 17.5-3.13-1.6 GM/177ML PO SOLN: 1.0000 | Freq: Once | ORAL | 0 refills | Status: AC

## 2018-01-23 NOTE — Progress Notes (Signed)
Per pt, no allergies to soy or egg products.Pt not taking any weight loss meds or using  O2 at home.  Emmi video sent to patient's email 

## 2018-01-26 ENCOUNTER — Encounter: Payer: Self-pay | Admitting: Gastroenterology

## 2018-01-30 DIAGNOSIS — G4733 Obstructive sleep apnea (adult) (pediatric): Secondary | ICD-10-CM | POA: Diagnosis not present

## 2018-01-31 ENCOUNTER — Other Ambulatory Visit: Payer: Self-pay | Admitting: *Deleted

## 2018-01-31 DIAGNOSIS — G4733 Obstructive sleep apnea (adult) (pediatric): Secondary | ICD-10-CM

## 2018-02-01 ENCOUNTER — Telehealth: Payer: Self-pay | Admitting: Pulmonary Disease

## 2018-02-01 DIAGNOSIS — G4733 Obstructive sleep apnea (adult) (pediatric): Secondary | ICD-10-CM

## 2018-02-01 NOTE — Telephone Encounter (Signed)
Dr. Wynona Neat has reviewed the home sleep test this showed Moderate obstructive sleep apena . Mild oxygen desaturations.  Recommendations   Treatment options are CPAP with the settings auto 5 to 15.    Weight loss measures .   Advise against driving while sleepy & against medication with sedative side effects.    Make appointment for 8 to 10 weeks for compliance with download with Dr. Wynona Neat.    ATC patient left message to call back.

## 2018-02-02 NOTE — Telephone Encounter (Signed)
Called and spoke with pt letting him know the results of HST and based on results, AO stated to begin him on cpap if he was okay.  Pt expressed understanding and stated cpap start would be fine. Order has been placed for cpap and pt has been scheduled for a f/u appt with AO after cpap start.  Nothing further needed.

## 2018-02-02 NOTE — Telephone Encounter (Signed)
Pt returning call. Pt contact number [phone]

## 2018-02-06 ENCOUNTER — Ambulatory Visit: Payer: Managed Care, Other (non HMO) | Admitting: Neurology

## 2018-02-06 ENCOUNTER — Encounter: Payer: Self-pay | Admitting: Neurology

## 2018-02-06 VITALS — BP 133/88 | HR 65 | Ht 70.0 in | Wt 199.5 lb

## 2018-02-06 DIAGNOSIS — G43709 Chronic migraine without aura, not intractable, without status migrainosus: Secondary | ICD-10-CM | POA: Diagnosis not present

## 2018-02-06 DIAGNOSIS — IMO0002 Reserved for concepts with insufficient information to code with codable children: Secondary | ICD-10-CM | POA: Insufficient documentation

## 2018-02-06 DIAGNOSIS — G4733 Obstructive sleep apnea (adult) (pediatric): Secondary | ICD-10-CM | POA: Diagnosis not present

## 2018-02-06 MED ORDER — SUMATRIPTAN SUCCINATE 100 MG PO TABS: 100.0000 mg | ORAL_TABLET | ORAL | 11 refills | Status: DC | PRN

## 2018-02-06 NOTE — Patient Instructions (Signed)
Magnesium oxide 400 mg twice a day Riboflavin= vitamin B2 100 mg twice a day 

## 2018-02-06 NOTE — Progress Notes (Signed)
PATIENT: Lance Long DOB: 1966-03-16  Chief Complaint  Patient presents with  . New Patient (Initial Visit)    PCP: William Hamburger, NP  . Migraine     HISTORICAL  Lance Long is a 52 years old male, seen in request by his primary care nurse practitioner William Hamburger for evaluation of headaches, initial evaluation was on Feb 06 2018.  I have reviewed and summarized the referring note from the referring physician.  He has past medical history of hypertension, obstructive sleep apnea, recent sleep study, was not compliant with his CPAP machine in the past,  He reported a history of migraine headaches since 20s, his typical migraine are holoacranial severe pounding headache with associated light noise sensitivity, nauseous, he has been taking Imitrex 50 mg as needed, which helped his headaches some, but sometimes he has to take second dose,  He recently began to work 2 jobs, 4 PM to 12 PM, then 4 AM to 9:30 AM, he does not have enough sleep, complains of increased headache over the past 6 months, couple times each week,  REVIEW OF SYSTEMS: Full 14 system review of systems performed and notable only for snoring, headache,  All other review of systems were negative.  ALLERGIES: Allergies  Allergen Reactions  . Oxycodone-Acetaminophen Itching    Per pt/ had a reaction to medication prior to surgery, not oxycodone    HOME MEDICATIONS: Current Outpatient Medications  Medication Sig Dispense Refill  . hydrochlorothiazide (HYDRODIURIL) 25 MG tablet Take 1 tablet (25 mg total) by mouth daily. 90 tablet 3  . Multiple Vitamin (MULTI VITAMIN DAILY) TABS Take by mouth daily.    . SUMAtriptan (IMITREX) 50 MG tablet Take 1 tablet (50 mg total) by mouth every 2 (two) hours as needed for migraine. May repeat in 2 hours if headache persists or recurs. 10 tablet 2   No current facility-administered medications for this visit.     PAST MEDICAL HISTORY: Past Medical History:  Diagnosis  Date  . Folliculitis barbae   . Hypertension   . Migraine   . OSA (obstructive sleep apnea)   . Sleep apnea    does not use c-pap machine    PAST SURGICAL HISTORY: Past Surgical History:  Procedure Laterality Date  . FOOT SURGERY Left 11.22.13  . TMJ JAW  08/1998    FAMILY HISTORY: Family History  Problem Relation Age of Onset  . Hypertension Mother   . Fibromyalgia Mother   . Diabetes Mother   . Liver cancer Father   . Heart attack Maternal Grandmother   . Diabetes Maternal Grandmother   . Hypertension Maternal Grandmother   . Stroke Maternal Grandmother   . Liver disease Maternal Grandfather   . Healthy Sister   . Healthy Brother     SOCIAL HISTORY: Social History   Socioeconomic History  . Marital status: Married    Spouse name: Not on file  . Number of children: Not on file  . Years of education: Not on file  . Highest education level: Not on file  Occupational History  . Not on file  Social Needs  . Financial resource strain: Not on file  . Food insecurity:    Worry: Not on file    Inability: Not on file  . Transportation needs:    Medical: Not on file    Non-medical: Not on file  Tobacco Use  . Smoking status: Never Smoker  . Smokeless tobacco: Never Used  Substance and Sexual Activity  .  Alcohol use: Yes    Alcohol/week: 2.0 standard drinks    Types: 2 Glasses of wine per week    Comment: occasional  . Drug use: No  . Sexual activity: Not Currently  Lifestyle  . Physical activity:    Days per week: Not on file    Minutes per session: Not on file  . Stress: Not on file  Relationships  . Social connections:    Talks on phone: Not on file    Gets together: Not on file    Attends religious service: Not on file    Active member of club or organization: Not on file    Attends meetings of clubs or organizations: Not on file    Relationship status: Not on file  . Intimate partner violence:    Fear of current or ex partner: Not on file     Emotionally abused: Not on file    Physically abused: Not on file    Forced sexual activity: Not on file  Other Topics Concern  . Not on file  Social History Narrative  . Not on file     PHYSICAL EXAM   Vitals:   02/06/18 0757  BP: 133/88  Pulse: 65  Weight: 199 lb 8 oz (90.5 kg)  Height: 5\' 10"  (1.778 m)    Not recorded      Body mass index is 28.63 kg/m.  PHYSICAL EXAMNIATION:  Gen: NAD, conversant, well nourised, obese, well groomed                     Cardiovascular: Regular rate rhythm, no peripheral edema, warm, nontender. Eyes: Conjunctivae clear without exudates or hemorrhage Neck: Supple, no carotid bruits. Pulmonary: Clear to auscultation bilaterally   NEUROLOGICAL EXAM:  MENTAL STATUS: Speech:    Speech is normal; fluent and spontaneous with normal comprehension.  Cognition:     Orientation to time, place and person     Normal recent and remote memory     Normal Attention span and concentration     Normal Language, naming, repeating,spontaneous speech     Fund of knowledge   CRANIAL NERVES: CN II: Visual fields are full to confrontation. Fundoscopic exam is normal with sharp discs and no vascular changes. Pupils are round equal and briskly reactive to light. CN III, IV, VI: extraocular movement are normal. No ptosis. CN V: Facial sensation is intact to pinprick in all 3 divisions bilaterally. Corneal responses are intact.  CN VII: Face is symmetric with normal eye closure and smile. CN VIII: Hearing is normal to rubbing fingers CN IX, X: Palate elevates symmetrically. Phonation is normal. CN XI: Head turning and shoulder shrug are intact CN XII: Tongue is midline with normal movements and no atrophy.  MOTOR: There is no pronator drift of out-stretched arms. Muscle bulk and tone are normal. Muscle strength is normal.  REFLEXES: Reflexes are 2+ and symmetric at the biceps, triceps, knees, and ankles. Plantar responses are  flexor.  SENSORY: Intact to light touch, pinprick, positional sensation and vibratory sensation are intact in fingers and toes.  COORDINATION: Rapid alternating movements and fine finger movements are intact. There is no dysmetria on finger-to-nose and heel-knee-shin.    GAIT/STANCE: Posture is normal. Gait is steady with normal steps, base, arm swing, and turning. Heel and toe walking are normal. Tandem gait is normal.  Romberg is absent.   DIAGNOSTIC DATA (LABS, IMAGING, TESTING) - I reviewed patient records, labs, notes, testing and imaging myself where available.  ASSESSMENT AND PLAN  Lance Long is a 52 y.o. male   Chronic migraine headaches  Worsened by irregular sleep pattern  Magnesium oxide 400 mg twice a day, riboflavin 100 mg twice a day as preventive medication  Encourage patient to get enough sleep, compliant with his CPAP machine  Imitrex 100 mg as needed, may mix together with Aleve  Levert Feinstein, M.D. Ph.D.  [facility] [address], [address] [place], [place] [address] Ph: [phone] Fax: [phone]  CC: Julaine Fusi, NP

## 2018-02-09 ENCOUNTER — Encounter: Payer: Self-pay | Admitting: Gastroenterology

## 2018-02-09 ENCOUNTER — Ambulatory Visit (AMBULATORY_SURGERY_CENTER): Payer: Managed Care, Other (non HMO) | Admitting: Gastroenterology

## 2018-02-09 VITALS — BP 107/71 | HR 63 | Temp 99.3°F | Resp 16 | Ht 70.0 in | Wt 200.0 lb

## 2018-02-09 DIAGNOSIS — Z1211 Encounter for screening for malignant neoplasm of colon: Secondary | ICD-10-CM

## 2018-02-09 MED ORDER — SODIUM CHLORIDE 0.9 % IV SOLN: 500.0000 mL | Freq: Once | INTRAVENOUS | Status: DC

## 2018-02-09 NOTE — Progress Notes (Signed)
Pt's states no medical or surgical changes since previsit or office visit. 

## 2018-02-09 NOTE — Progress Notes (Signed)
Report given to PACU, vss 

## 2018-02-09 NOTE — Patient Instructions (Signed)
YOU HAD AN ENDOSCOPIC PROCEDURE TODAY AT THE [facility]:   Refer to the procedure report that was given to you for any specific questions about what was found during the examination.  If the procedure report does not answer your questions, please call your gastroenterologist to clarify.  If you requested that your care partner not be given the details of your procedure findings, then the procedure report has been included in a sealed envelope for you to review at your convenience later.  YOU SHOULD EXPECT: Some feelings of bloating in the abdomen. Passage of more gas than usual.  Walking can help get rid of the air that was put into your GI tract during the procedure and reduce the bloating. If you had a lower endoscopy (such as a colonoscopy or flexible sigmoidoscopy) you may notice spotting of blood in your stool or on the toilet paper. If you underwent a bowel prep for your procedure, you may not have a normal bowel movement for a few days.  Please Note:  You might notice some irritation and congestion in your nose or some drainage.  This is from the oxygen used during your procedure.  There is no need for concern and it should clear up in a day or so.  SYMPTOMS TO REPORT IMMEDIATELY:   Following lower endoscopy (colonoscopy or flexible sigmoidoscopy):  Excessive amounts of blood in the stool  Significant tenderness or worsening of abdominal pains  Swelling of the abdomen that is new, acute  Fever of 100F or higher   Following upper endoscopy (EGD)  Vomiting of blood or coffee ground material  New chest pain or pain under the shoulder blades  Painful or persistently difficult swallowing  New shortness of breath  Fever of 100F or higher  Black, tarry-looking stools  For urgent or emergent issues, a gastroenterologist can be reached at any hour by calling [phone].   DIET:  We do recommend a small meal at first, but then you may proceed to your regular diet.  Drink  plenty of fluids but you should avoid alcoholic beverages for 24 hours.  ACTIVITY:  You should plan to take it easy for the rest of today and you should NOT DRIVE or use heavy machinery until tomorrow (because of the sedation medicines used during the test).    FOLLOW UP: Our staff will call the number listed on your records the next business day following your procedure to check on you and address any questions or concerns that you may have regarding the information given to you following your procedure. If we do not reach you, we will leave a message.  However, if you are feeling well and you are not experiencing any problems, there is no need to return our call.  We will assume that you have returned to your regular daily activities without incident.  If any biopsies were taken you will be contacted by phone or by letter within the next 1-3 weeks.  Please call us at [phone] if you have not heard about the biopsies in 3 weeks.    SIGNATURES/CONFIDENTIALITY: You and/or your care partner have signed paperwork which will be entered into your electronic medical record.  These signatures attest to the fact that that the information above on your After Visit Summary has been reviewed and is understood.  Full responsibility of the confidentiality of this discharge information lies with you and/or your care-partner.  Hemorrhoid information given. 

## 2018-02-09 NOTE — Op Note (Signed)
[facility] Patient Name: Lance Long Procedure Date: 02/09/2018 1:18 PM MRN: [id number] Endoscopist: Meryl Dare , MD Age: 52 Referring MD:  Date of Birth: [birth year] Gender: Male Account #: [id number] Procedure:                Colonoscopy Indications:              Screening for colorectal malignant neoplasm Medicines:                Monitored Anesthesia Care Procedure:                Pre-Anesthesia Assessment:                           - Prior to the procedure, a History and Physical                            was performed, and patient medications and                            allergies were reviewed. The patient's tolerance of                            previous anesthesia was also reviewed. The risks                            and benefits of the procedure and the sedation                            options and risks were discussed with the patient.                            All questions were answered, and informed consent                            was obtained. Prior Anticoagulants: The patient has                            taken no previous anticoagulant or antiplatelet                            agents. ASA Grade Assessment: II - A patient with                            mild systemic disease. After reviewing the risks                            and benefits, the patient was deemed in                            satisfactory condition to undergo the procedure.                           After obtaining informed consent, the colonoscope  was passed under direct vision. Throughout the                            procedure, the patient's blood pressure, pulse, and                            oxygen saturations were monitored continuously. The                            Model [id number]-[id number] scope was introduced                            through the anus and advanced to the the cecum,                            identified by  appendiceal orifice and ileocecal                            valve. The ileocecal valve, appendiceal orifice,                            and rectum were photographed. The quality of the                            bowel preparation was good. The colonoscopy was                            performed without difficulty. The patient tolerated                            the procedure well. Scope In: 1:27:44 PM Scope Out: 1:40:41 PM Scope Withdrawal Time: 0 hours 9 minutes 56 seconds  Total Procedure Duration: 0 hours 12 minutes 57 seconds  Findings:                 The perianal and digital rectal examinations were                            normal.                           Internal hemorrhoids were found during                            retroflexion. The hemorrhoids were medium-sized and                            Grade I (internal hemorrhoids that do not prolapse).                           The exam was otherwise without abnormality on                            direct and retroflexion views. Complications:            No immediate complications.  Estimated blood loss:                            None. Estimated Blood Loss:     Estimated blood loss: none. Impression:               - Internal hemorrhoids.                           - The examination was otherwise normal on direct                            and retroflexion views.                           - No specimens collected. Recommendation:           - Repeat colonoscopy in 10 years for screening                            purposes.                           - Patient has a contact number available for                            emergencies. The signs and symptoms of potential                            delayed complications were discussed with the                            patient. Return to normal activities tomorrow.                            Written discharge instructions were provided to the                            patient.                            - Resume previous diet.                           - Continue present medications. Meryl Dare, MD 02/09/2018 1:43:38 PM This report has been signed electronically.

## 2018-02-12 ENCOUNTER — Telehealth: Payer: Self-pay

## 2018-02-12 NOTE — Telephone Encounter (Signed)
  Follow up Call-  Call Lance Long number 02/09/2018  Post procedure Call Lance Long phone  # [phone]  Permission to leave phone message Yes  Some recent data might be hidden     Patient questions:  Do you have a fever, pain , or abdominal swelling? No. Pain Score  0 *  Have you tolerated food without any problems? Yes.    Have you been able to return to your normal activities? Yes.    Do you have any questions about your discharge instructions: Diet   No. Medications  No. Follow up visit  No.  Do you have questions or concerns about your Care? No.  Actions: * If pain score is 4 or above: No action needed, pain <4.

## 2018-02-12 NOTE — Telephone Encounter (Signed)
Left message, did not reach for follow up phone call.

## 2018-02-21 ENCOUNTER — Other Ambulatory Visit: Payer: Self-pay

## 2018-02-21 DIAGNOSIS — Z114 Encounter for screening for human immunodeficiency virus [HIV]: Secondary | ICD-10-CM

## 2018-02-21 DIAGNOSIS — Z0000 Encounter for general adult medical examination without abnormal findings: Secondary | ICD-10-CM

## 2018-02-21 DIAGNOSIS — Z1159 Encounter for screening for other viral diseases: Secondary | ICD-10-CM

## 2018-02-21 DIAGNOSIS — Z833 Family history of diabetes mellitus: Secondary | ICD-10-CM

## 2018-02-22 LAB — COMPREHENSIVE METABOLIC PANEL
A/G RATIO: 1.5 (ref 1.2–2.2)
ALBUMIN: 4.2 g/dL (ref 3.5–5.5)
ALT: 28 IU/L (ref 0–44)
AST: 30 IU/L (ref 0–40)
Alkaline Phosphatase: 63 IU/L (ref 39–117)
BILIRUBIN TOTAL: 0.3 mg/dL (ref 0.0–1.2)
BUN / CREAT RATIO: 8 — AB (ref 9–20)
BUN: 9 mg/dL (ref 6–24)
CALCIUM: 9 mg/dL (ref 8.7–10.2)
CHLORIDE: 107 mmol/L — AB (ref 96–106)
CO2: 21 mmol/L (ref 20–29)
Creatinine, Ser: 1.15 mg/dL (ref 0.76–1.27)
GFR, EST AFRICAN AMERICAN: 85 mL/min/{1.73_m2} (ref >59)
GFR, EST NON AFRICAN AMERICAN: 73 mL/min/{1.73_m2} (ref >59)
Globulin, Total: 2.8 g/dL (ref 1.5–4.5)
Glucose: 93 mg/dL (ref 65–99)
POTASSIUM: 4.2 mmol/L (ref 3.5–5.2)
Sodium: 143 mmol/L (ref 134–144)
TOTAL PROTEIN: 7 g/dL (ref 6.0–8.5)

## 2018-02-22 LAB — CBC WITH DIFFERENTIAL/PLATELET
BASOS ABS: 0.1 10*3/uL (ref 0.0–0.2)
BASOS: 1 %
EOS (ABSOLUTE): 0.1 10*3/uL (ref 0.0–0.4)
Eos: 2 %
HEMATOCRIT: 46.1 % (ref 37.5–51.0)
HEMOGLOBIN: 16.1 g/dL (ref 13.0–17.7)
Immature Grans (Abs): 0 10*3/uL (ref 0.0–0.1)
Immature Granulocytes: 0 %
Lymphocytes Absolute: 2.3 10*3/uL (ref 0.7–3.1)
Lymphs: 34 %
MCH: 30.6 pg (ref 26.6–33.0)
MCHC: 34.9 g/dL (ref 31.5–35.7)
MCV: 88 fL (ref 79–97)
MONOS ABS: 0.7 10*3/uL (ref 0.1–0.9)
Monocytes: 10 %
NEUTROS ABS: 3.7 10*3/uL (ref 1.4–7.0)
Neutrophils: 53 %
Platelets: 258 10*3/uL (ref 150–450)
RBC: 5.27 x10E6/uL (ref 4.14–5.80)
RDW: 13 % (ref 12.3–15.4)
WBC: 6.9 10*3/uL (ref 3.4–10.8)

## 2018-02-22 LAB — LIPID PANEL
CHOLESTEROL TOTAL: 147 mg/dL (ref 100–199)
Chol/HDL Ratio: 2.8 ratio (ref 0.0–5.0)
HDL: 53 mg/dL (ref >39)
LDL CALC: 86 mg/dL (ref 0–99)
TRIGLYCERIDES: 40 mg/dL (ref 0–149)
VLDL Cholesterol Cal: 8 mg/dL (ref 5–40)

## 2018-02-22 LAB — TSH: TSH: 1.24 u[IU]/mL (ref 0.450–4.500)

## 2018-02-22 LAB — HEPATITIS C ANTIBODY: Hep C Virus Ab: <0.1 s/co ratio (ref 0.0–0.9)

## 2018-02-22 LAB — HEMOGLOBIN A1C
Est. average glucose Bld gHb Est-mCnc: 111 mg/dL
Hgb A1c MFr Bld: 5.5 % (ref 4.8–5.6)

## 2018-02-22 LAB — HIV ANTIBODY (ROUTINE TESTING W REFLEX): HIV Screen 4th Generation wRfx: NONREACTIVE

## 2018-02-22 NOTE — Progress Notes (Addendum)
Subjective:    Patient ID: Lance Long, male    DOB: 1965-07-21, 52 y.o.   MRN: [id number]  HPI :12/19/17 OV:   Lance Long is here to establish as a new pt.  He is a pleasant 52 year old male. PMH:  HTN, Migraines with aura He has been on HCTZ 25mg  QD for >15 years He was initially dx'd with migraine at age 21 (11 years ago).  He would experience  1-3 migraines/month and then they eventually reduced to once/month. The last 4 weeks he reports HAs have increased to 2/week- will last all day and cause nausea w/o vomiting. He reports pain will "be on the top of my head", described as aching and rates 8/10. He previously used Imitrex, however has been out "for quite awhile" He has been using OTC NSAIDs with only minimal sx relief. He estimates to drink >gallon water/day and eats only one small meal/day His wife of 27 years passed away 04/24/19He has one 25 yr old daughter "Lance Long" that is living with him He is not in therapy, declined CBT referral He reports strong support system of family/friends and denies acute depression He denies tobacco use and seldom drinks ETOH He works three jobs, averaging >95 hrs/week He exercises- cardio at least once week for 30 minutes  02/26/18 OV: Lance Long is here for CPE He has been able to reduce work hours per week to 40-45, which has allowed him to dramatically increase time for exercise- now able to exercise 5-6 times week, cardio and weigh training He has a new girlfriend in his life and is experiencing ED, most likely r/t to performance anxiety since this is his first girlfriend since the death of his wife 2017-08-23(thet were together for >27 years). Reviewed most recent labs- overall stable  Healthcare Maintenance: Colonoscopy-completed 01/2018, repeat in 10 years Immunizations- UTD  Patient Care Team    Relationship Specialty Notifications Start End  Julaine Fusi, NP PCP - General Family Medicine  12/19/17     Patient Active Problem  List   Diagnosis Date Noted  . Acute pain of right knee 11/14/2018  . Leg numbness 06/13/2018  . Leg weakness, bilateral 06/13/2018  . Erectile dysfunction 02/26/2018  . Chronic migraine 02/06/2018  . Obstructive sleep apnea 02/06/2018  . Migraine 12/19/2017  . Healthcare maintenance 12/19/2017  . OSA (obstructive sleep apnea) 10/29/2014  . Benign essential HTN 10/29/2014  . Obesity (BMI 30-39.9) 10/29/2014  . Closed fracture of metatarsal bone(s) 02/12/2013     Past Medical History:  Diagnosis Date  . Folliculitis barbae   . Hypertension   . Migraine   . OSA (obstructive sleep apnea)   . Sleep apnea    does not use c-pap machine     Past Surgical History:  Procedure Laterality Date  . FOOT SURGERY Left 11.22.13  . TMJ JAW  08/1998     Family History  Problem Relation Age of Onset  . Hypertension Mother   . Fibromyalgia Mother   . Diabetes Mother   . Liver cancer Father   . Heart attack Maternal Grandmother   . Diabetes Maternal Grandmother   . Hypertension Maternal Grandmother   . Stroke Maternal Grandmother   . Liver disease Maternal Grandfather   . Healthy Sister   . Healthy Brother   . Colon cancer Neg Hx   . Colon polyps Neg Hx   . Esophageal cancer Neg Hx   . Rectal cancer Neg Hx   .  Stomach cancer Neg Hx      Social History   Substance and Sexual Activity  Drug Use No     Social History   Substance and Sexual Activity  Alcohol Use Yes  . Alcohol/week: 2.0 standard drinks  . Types: 2 Glasses of wine per week   Comment: occasional     Social History   Tobacco Use  Smoking Status Never Smoker  Smokeless Tobacco Never Used     Outpatient Encounter Medications as of 02/26/2018  Medication Sig  . Multiple Vitamin (MULTI VITAMIN DAILY) TABS Take by mouth daily.  . SUMAtriptan (IMITREX) 100 MG tablet Take 1 tablet (100 mg total) by mouth every 2 (two) hours as needed for migraine. May repeat in 2 hours if headache persists or recurs.   . [DISCONTINUED] hydrochlorothiazide (HYDRODIURIL) 25 MG tablet Take 1 tablet (25 mg total) by mouth daily.  . [DISCONTINUED] sildenafil (VIAGRA) 50 MG tablet 1/2 to 1 tablet to 4 hrs prior to sexual activity   No facility-administered encounter medications on file as of 02/26/2018.     Allergies: Patient has no known allergies.  Body mass index is 28.57 kg/m.  Blood pressure (!) 151/96, pulse 78, height 5\' 10"  (1.778 m), weight 199 lb 1.6 oz (90.3 kg), SpO2 100 %.  Review of Systems  Constitutional: Positive for fatigue. Negative for activity change, appetite change, chills, diaphoresis, fever and unexpected weight change.  Eyes: Negative for visual disturbance.  Respiratory: Negative for cough, chest tightness, shortness of breath, wheezing and stridor.   Cardiovascular: Negative for chest pain, palpitations and leg swelling.  Gastrointestinal: Negative for abdominal distention, abdominal pain, blood in stool, constipation, diarrhea, nausea and vomiting.  Genitourinary: Negative for difficulty urinating and flank pain.  Musculoskeletal: Negative for arthralgias, back pain, gait problem, joint swelling, myalgias, neck pain and neck stiffness.  Skin: Negative for color change, pallor, rash and wound.  Neurological: Positive for headaches. Negative for dizziness.  Hematological: Does not bruise/bleed easily.  Psychiatric/Behavioral: Negative for confusion, decreased concentration, dysphoric mood, hallucinations, self-injury, sleep disturbance and suicidal ideas. The patient is not nervous/anxious and is not hyperactive.        Objective:   Physical Exam  Constitutional: He is oriented to person, place, and time. He appears well-developed and well-nourished. No distress.  HENT:  Head: Normocephalic and atraumatic.  Right Ear: External ear normal. Tympanic membrane is not perforated and not bulging. No decreased hearing is noted.  Left Ear: External ear normal. Tympanic  membrane is not perforated and not bulging. No decreased hearing is noted.  Nose: Nose normal. No mucosal edema or rhinorrhea. Right sinus exhibits no maxillary sinus tenderness and no frontal sinus tenderness. Left sinus exhibits no maxillary sinus tenderness and no frontal sinus tenderness.  Mouth/Throat: Oropharynx is clear and moist and mucous membranes are normal. Normal dentition. No oropharyngeal exudate, posterior oropharyngeal edema, posterior oropharyngeal erythema or tonsillar abscesses. Tonsils are 0 on the right. Tonsils are 0 on the left. No tonsillar exudate.  Eyes: Pupils are equal, round, and reactive to light. Conjunctivae and EOM are normal.  Neck: Normal range of motion. Neck supple.  Cardiovascular: Normal rate, regular rhythm, normal heart sounds and intact distal pulses.  No murmur heard. Pulmonary/Chest: Effort normal and breath sounds normal. No stridor. No respiratory distress. He has no wheezes. He has no rales. He exhibits no tenderness.  Abdominal: Soft. Bowel sounds are normal. He exhibits no distension and no mass. There is no tenderness. There is no rebound  and no guarding. No hernia.  Genitourinary: Rectum normal.  Genitourinary Comments: Chaperone present during examination. Unable to reach prostate due body habitus, recommended referral to Urology   Lymphadenopathy:    He has no cervical adenopathy.  Neurological: He is alert and oriented to person, place, and time.  Skin: Skin is warm and dry. Capillary refill takes less than 2 seconds. No rash noted. He is not diaphoretic. No erythema. No pallor.  Psychiatric: He has a normal mood and affect. His behavior is normal. Judgment and thought content normal.  Nursing note and vitals reviewed.     Assessment & Plan:   1. Need for influenza vaccination   2. Need for Tdap vaccination   3. Benign essential HTN   4. Healthcare maintenance   5. Erectile dysfunction, unspecified erectile dysfunction type      Benign essential HTN Both BPs above goal, most likely r/t to white coat syndrome  He has only been on HCTZ for BP control He has recently increased regular exercise F/u 3 months, if BP still above goal will add CCB   Healthcare maintenance Please continue all medications as directed. Continue to drink plenty of water and follow Heart Healthy diet. Continue regular exercise. Short prescription of Viagr provided to help with anxiety induced erectile dysfunction, not intended for long-term use. Unable to perform prostate examination, if you notice any urinary changes, please call and we will refer you Urology. Recent labs look very good! Follow-up in 3 months.  Erectile dysfunction Likely r/t to anxiety- This is first new partner after death of his wife of 27 years A1c 5.5 He exercises regularly, does not use tobacco Slight elevation in BP today Short prescription of Viagr provided to help with anxiety induced erectile dysfunction, not intended for long-term use.    FOLLOW-UP:  Return in about 3 months (around 05/29/2018) for Regular Follow Up, HTN.

## 2018-02-26 ENCOUNTER — Encounter: Payer: Self-pay | Admitting: Adult Health

## 2018-02-26 ENCOUNTER — Ambulatory Visit (INDEPENDENT_AMBULATORY_CARE_PROVIDER_SITE_OTHER): Payer: Managed Care, Other (non HMO) | Admitting: Adult Health

## 2018-02-26 VITALS — BP 151/96 | HR 78 | Ht 70.0 in | Wt 199.1 lb

## 2018-02-26 DIAGNOSIS — Z0000 Encounter for general adult medical examination without abnormal findings: Secondary | ICD-10-CM

## 2018-02-26 DIAGNOSIS — N529 Male erectile dysfunction, unspecified: Secondary | ICD-10-CM

## 2018-02-26 DIAGNOSIS — Z23 Encounter for immunization: Secondary | ICD-10-CM | POA: Diagnosis not present

## 2018-02-26 DIAGNOSIS — I10 Essential (primary) hypertension: Secondary | ICD-10-CM

## 2018-02-26 MED ORDER — SILDENAFIL CITRATE 50 MG PO TABS: ORAL_TABLET | ORAL | 0 refills | Status: DC

## 2018-02-26 NOTE — Patient Instructions (Addendum)

## 2018-02-26 NOTE — Assessment & Plan Note (Addendum)
Both BPs above goal, most likely r/t to white coat syndrome  He has only been on HCTZ for BP control He has recently increased regular exercise F/u 3 months, if BP still above goal will add CCB

## 2018-02-26 NOTE — Assessment & Plan Note (Signed)
Please continue all medications as directed. Continue to drink plenty of water and follow Heart Healthy diet. Continue regular exercise. Short prescription of Viagr provided to help with anxiety induced erectile dysfunction, not intended for long-term use. Unable to perform prostate examination, if you notice any urinary changes, please call and we will refer you Urology. Recent labs look very good! Follow-up in 3 months.

## 2018-02-26 NOTE — Assessment & Plan Note (Signed)
Likely r/t to anxiety- This is first new partner after death of his wife of 27 years A1c 5.5 He exercises regularly, does not use tobacco Slight elevation in BP today Short prescription of Viagr provided to help with anxiety induced erectile dysfunction, not intended for long-term use.

## 2018-03-26 ENCOUNTER — Encounter: Payer: Managed Care, Other (non HMO) | Admitting: Adult Health

## 2018-04-10 ENCOUNTER — Telehealth: Payer: Self-pay | Admitting: Pulmonary Disease

## 2018-04-10 NOTE — Telephone Encounter (Signed)
Called [facility] & [place] to [place].  They have the order.  They called pt on 10/9, 10/10, 10/11 & 10/14 and left messages for the pt.  They called on 10/16 and mailbox was full.  I called the pt today with Tonya on the phone & he did not answer.  I left vm for him that he just needs to call [facility] & they will be able to provide his machine.  I gave him their phone # to call.  Archie Pattenonya is going to document on her end as well.  Nothing further needed.

## 2018-04-10 NOTE — Telephone Encounter (Signed)
An order for a new cpap was sent to [facility] on 02/02/2018 and pt states he still has not heard from a DME company to receive cpap.    PCCs please advise on status of order.  Thanks!

## 2018-04-11 ENCOUNTER — Ambulatory Visit: Payer: Managed Care, Other (non HMO) | Admitting: Pulmonary Disease

## 2018-04-14 ENCOUNTER — Other Ambulatory Visit: Payer: Self-pay | Admitting: Adult Health

## 2018-04-16 NOTE — Telephone Encounter (Signed)
Please review and refill if appropriate.  Lance Long, CMA  

## 2018-05-23 NOTE — Progress Notes (Signed)
Subjective:    Patient ID: Lance Long, male    DOB: 10/18/1965, 53 y.o.   MRN: [id number]  HPI :12/19/17 OV:   Lance Long is here to establish as a new pt.  He is a pleasant 53 year old male. PMH:  HTN, Migraines with aura He has been on HCTZ 25mg  QD for >15 years He was initially dx'd with migraine at age 89 (11 years ago).  He would experience  1-3 migraines/month and then they eventually reduced to once/month. The last 4 weeks he reports HAs have increased to 2/week- will last all day and cause nausea w/o vomiting. He reports pain will "be on the top of my head", described as aching and rates 8/10. He previously used Imitrex, however has been out "for quite awhile" He has been using OTC NSAIDs with only minimal sx relief. He estimates to drink >gallon water/day and eats only one small meal/day His wife of 27 years passed away April 2019 He has one 53 yr old daughter "[name]" that is living with him He is not in therapy, declined CBT referral He reports strong support system of family/friends and denies acute depression He denies tobacco use and seldom drinks ETOH He works three jobs, averaging >95 hrs/week He exercises- cardio at least once week for 30 minutes  02/26/18 OV: Lance Long is here for CPE He has been able to reduce work hours per week to 40-45, which has allowed him to dramatically increase time for exercise- now able to exercise 5-6 times week, cardio and weigh training He has a new girlfriend in his life and is experiencing ED, most likely r/t to performance anxiety since this is his first girlfriend since the death of his wife April 2019 (thet were together for >27 years). Reviewed most recent labs- overall stable  05/29/2018 OV: Lance Long presents for 3 month f/u: HTN, migraine with aura He has increased regular exercise- M/W/F upper body weight training with 10min cardio T/R Lower body weight training with 20min cardio Has has dramatically reduced Na+ intake He  denies CP/dyspnea/dizziness/HA/palpitations He reports "feeling great" He continues to abstain from tobacco/vape/excessive ETOH use  Patient Care Team    Relationship Specialty Notifications Start End  Julaine Fusianford,  D, NP PCP - General Family Medicine  12/19/17     Patient Active Problem List   Diagnosis Date Noted  . Erectile dysfunction 02/26/2018  . Chronic migraine 02/06/2018  . Obstructive sleep apnea 02/06/2018  . Migraine 12/19/2017  . Healthcare maintenance 12/19/2017  . OSA (obstructive sleep apnea) 10/29/2014  . Benign essential HTN 10/29/2014  . Obesity (BMI 30-39.9) 10/29/2014  . Closed fracture of metatarsal bone(s) 02/12/2013     Past Medical History:  Diagnosis Date  . Folliculitis barbae   . Hypertension   . Migraine   . OSA (obstructive sleep apnea)   . Sleep apnea    does not use c-pap machine     Past Surgical History:  Procedure Laterality Date  . FOOT SURGERY Left 11.22.13  . TMJ JAW  08/1998     Family History  Problem Relation Age of Onset  . Hypertension Mother   . Fibromyalgia Mother   . Diabetes Mother   . Liver cancer Father   . Heart attack Maternal Grandmother   . Diabetes Maternal Grandmother   . Hypertension Maternal Grandmother   . Stroke Maternal Grandmother   . Liver disease Maternal Grandfather   . Healthy Sister   . Healthy Brother   .  Colon cancer Neg Hx   . Colon polyps Neg Hx   . Esophageal cancer Neg Hx   . Rectal cancer Neg Hx   . Stomach cancer Neg Hx      Social History   Substance and Sexual Activity  Drug Use No     Social History   Substance and Sexual Activity  Alcohol Use Yes  . Alcohol/week: 2.0 standard drinks  . Types: 2 Glasses of wine per week   Comment: occasional     Social History   Tobacco Use  Smoking Status Never Smoker  Smokeless Tobacco Never Used     Outpatient Encounter Medications as of 05/29/2018  Medication Sig  . hydrochlorothiazide (HYDRODIURIL) 25 MG tablet Take  1 tablet (25 mg total) by mouth daily.  . Multiple Vitamin (MULTI VITAMIN DAILY) TABS Take by mouth daily.  . sildenafil (VIAGRA) 50 MG tablet TAKE 1/2 TO 1 TABLET TO 4 HOURS PRIOR TO SEXUAL ACTIVITY  . SUMAtriptan (IMITREX) 100 MG tablet Take 1 tablet (100 mg total) by mouth every 2 (two) hours as needed for migraine. May repeat in 2 hours if headache persists or recurs.   No facility-administered encounter medications on file as of 05/29/2018.     Allergies: Oxycodone-acetaminophen  Body mass index is 29.03 kg/m.  Blood pressure 126/85, pulse 62, temperature 98.5 F (36.9 C), temperature source Oral, height 5\' 10"  (1.778 m), weight 202 lb 4.8 oz (91.8 kg), SpO2 99 %.  Review of Systems  Constitutional: Positive for fatigue. Negative for activity change, appetite change, chills, diaphoresis, fever and unexpected weight change.  Eyes: Negative for visual disturbance.  Respiratory: Negative for cough, chest tightness, shortness of breath, wheezing and stridor.   Cardiovascular: Negative for chest pain, palpitations and leg swelling.  Gastrointestinal: Negative for abdominal distention, abdominal pain, blood in stool, constipation, diarrhea, nausea and vomiting.  Genitourinary: Negative for difficulty urinating and flank pain.  Musculoskeletal: Negative for arthralgias, back pain, gait problem, joint swelling, myalgias, neck pain and neck stiffness.  Skin: Negative for color change, pallor, rash and wound.  Neurological: Positive for headaches. Negative for dizziness.  Hematological: Does not bruise/bleed easily.  Psychiatric/Behavioral: Negative for confusion, decreased concentration, dysphoric mood, hallucinations, self-injury, sleep disturbance and suicidal ideas. The patient is not nervous/anxious and is not hyperactive.        Objective:   Physical Exam Vitals signs and nursing note reviewed.  Constitutional:      General: He is not in acute distress.    Appearance: He  is well-developed. He is not diaphoretic.  HENT:     Head: Normocephalic and atraumatic.     Right Ear: External ear normal. No decreased hearing noted. Tympanic membrane is not perforated or bulging.     Left Ear: External ear normal. No decreased hearing noted. Tympanic membrane is not perforated or bulging.     Nose: Nose normal. No mucosal edema or rhinorrhea.     Right Sinus: No maxillary sinus tenderness or frontal sinus tenderness.     Left Sinus: No maxillary sinus tenderness or frontal sinus tenderness.     Mouth/Throat:     Dentition: Normal dentition.     Pharynx: No oropharyngeal exudate or posterior oropharyngeal erythema.     Tonsils: No tonsillar exudate or tonsillar abscesses. Swelling: 0 on the right. 0 on the left.  Eyes:     Conjunctiva/sclera: Conjunctivae normal.     Pupils: Pupils are equal, round, and reactive to light.  Neck:  Musculoskeletal: Normal range of motion and neck supple.  Cardiovascular:     Rate and Rhythm: Normal rate and regular rhythm.     Heart sounds: Normal heart sounds. No murmur.  Pulmonary:     Effort: Pulmonary effort is normal. No respiratory distress.     Breath sounds: Normal breath sounds. No stridor. No wheezing or rales.  Chest:     Chest wall: No tenderness.  Abdominal:     General: Bowel sounds are normal. There is no distension.     Palpations: Abdomen is soft. There is no mass.     Tenderness: There is no abdominal tenderness. There is no guarding or rebound.     Hernia: No hernia is present.  Lymphadenopathy:     Cervical: No cervical adenopathy.  Skin:    General: Skin is warm and dry.     Capillary Refill: Capillary refill takes less than 2 seconds.     Coloration: Skin is not pale.     Findings: No erythema or rash.  Neurological:     Mental Status: He is alert and oriented to person, place, and time.  Psychiatric:        Behavior: Behavior normal.        Thought Content: Thought content normal.        Judgment:  Judgment normal.       Assessment & Plan:   1. Benign essential HTN   2. Chronic migraine   3. Healthcare maintenance     Healthcare maintenance EXCELLENT BLOOD PRESSURE! Continue regular exercise and following DASH eating program. When you need refills on medications, please call pharmacy and they will contact us. Follow-up Oct for complete physical, week prior please schedule fasting lab appt.  Benign essential HTN BP at goal 126/85, HR 62 Continue on HCTZ 25mg  QD Continue regular exercise and DASH eating   Chronic migraine Stable Continue PRN sumatriptan 100mg  Remain well hydrated and continue to abstain from tobacco/vape use    FOLLOW-UP:  Return in about 9 months (around 02/27/2019) for CPE, Fasting Labs.

## 2018-05-29 ENCOUNTER — Encounter: Payer: Self-pay | Admitting: Adult Health

## 2018-05-29 ENCOUNTER — Ambulatory Visit (INDEPENDENT_AMBULATORY_CARE_PROVIDER_SITE_OTHER): Payer: Managed Care, Other (non HMO) | Admitting: Adult Health

## 2018-05-29 VITALS — BP 126/85 | HR 62 | Temp 98.5°F | Ht 70.0 in | Wt 202.3 lb

## 2018-05-29 DIAGNOSIS — I10 Essential (primary) hypertension: Secondary | ICD-10-CM | POA: Diagnosis not present

## 2018-05-29 DIAGNOSIS — Z0000 Encounter for general adult medical examination without abnormal findings: Secondary | ICD-10-CM | POA: Diagnosis not present

## 2018-05-29 DIAGNOSIS — G43709 Chronic migraine without aura, not intractable, without status migrainosus: Secondary | ICD-10-CM | POA: Diagnosis not present

## 2018-05-29 DIAGNOSIS — IMO0002 Reserved for concepts with insufficient information to code with codable children: Secondary | ICD-10-CM

## 2018-05-29 NOTE — Assessment & Plan Note (Signed)
Stable Continue PRN sumatriptan 100mg  Remain well hydrated and continue to abstain from tobacco/vape use

## 2018-05-29 NOTE — Patient Instructions (Addendum)
Hypertension Hypertension, commonly called high blood pressure, is when the force of blood pumping through the arteries is too strong. The arteries are the blood vessels that carry blood from the heart throughout the body. Hypertension forces the heart to work harder to pump blood and may cause arteries to become narrow or stiff. Having untreated or uncontrolled hypertension can cause heart attacks, strokes, kidney disease, and other problems. A blood pressure reading consists of a higher number over a lower number. Ideally, your blood pressure should be below 120/80. The first ("top") number is called the systolic pressure. It is a measure of the pressure in your arteries as your heart beats. The second ("bottom") number is called the diastolic pressure. It is a measure of the pressure in your arteries as the heart relaxes. What are the causes? The cause of this condition is not known. What increases the risk? Some risk factors for high blood pressure are under your control. Others are not. Factors you can change  Smoking.  Having type 2 diabetes mellitus, high cholesterol, or both.  Not getting enough exercise or physical activity.  Being overweight.  Having too much fat, sugar, calories, or salt (sodium) in your diet.  Drinking too much alcohol. Factors that are difficult or impossible to change  Having chronic kidney disease.  Having a family history of high blood pressure.  Age. Risk increases with age.  Race. You may be at higher risk if you are African-American.  Gender. Men are at higher risk than women before age 45. After age 65, women are at higher risk than men.  Having obstructive sleep apnea.  Stress. What are the signs or symptoms? Extremely high blood pressure (hypertensive crisis) may cause:  Headache.  Anxiety.  Shortness of breath.  Nosebleed.  Nausea and vomiting.  Severe chest pain.  Jerky movements you cannot control (seizures). How is this  diagnosed? This condition is diagnosed by measuring your blood pressure while you are seated, with your arm resting on a surface. The cuff of the blood pressure monitor will be placed directly against the skin of your upper arm at the level of your heart. It should be measured at least twice using the same arm. Certain conditions can cause a difference in blood pressure between your right and left arms. Certain factors can cause blood pressure readings to be lower or higher than normal (elevated) for a short period of time:  When your blood pressure is higher when you are in a health care provider's office than when you are at home, this is called white coat hypertension. Most people with this condition do not need medicines.  When your blood pressure is higher at home than when you are in a health care provider's office, this is called masked hypertension. Most people with this condition may need medicines to control blood pressure. If you have a high blood pressure reading during one visit or you have normal blood pressure with other risk factors:  You may be asked to return on a different day to have your blood pressure checked again.  You may be asked to monitor your blood pressure at home for 1 week or longer. If you are diagnosed with hypertension, you may have other blood or imaging tests to help your health care provider understand your overall risk for other conditions. How is this treated? This condition is treated by making healthy lifestyle changes, such as eating healthy foods, exercising more, and reducing your alcohol intake. Your health care provider   may prescribe medicine if lifestyle changes are not enough to get your blood pressure under control, and if:  Your systolic blood pressure is above 130.  Your diastolic blood pressure is above 80. Your personal target blood pressure may vary depending on your medical conditions, your age, and other factors. Follow these instructions  at home: Eating and drinking   Eat a diet that is high in fiber and potassium, and low in sodium, added sugar, and fat. An example eating plan is called the DASH (Dietary Approaches to Stop Hypertension) diet. To eat this way: ? Eat plenty of fresh fruits and vegetables. Try to fill half of your plate at each meal with fruits and vegetables. ? Eat whole grains, such as whole wheat pasta, brown rice, or whole grain bread. Fill about one quarter of your plate with whole grains. ? Eat or drink low-fat dairy products, such as skim milk or low-fat yogurt. ? Avoid fatty cuts of meat, processed or cured meats, and poultry with skin. Fill about one quarter of your plate with lean proteins, such as fish, chicken without skin, beans, eggs, and tofu. ? Avoid premade and processed foods. These tend to be higher in sodium, added sugar, and fat.  Reduce your daily sodium intake. Most people with hypertension should eat less than 1,500 mg of sodium a day.  Limit alcohol intake to no more than 1 drink a day for nonpregnant women and 2 drinks a day for men. One drink equals 12 oz of beer, 5 oz of wine, or 1 oz of hard liquor. Lifestyle   Work with your health care provider to maintain a healthy body weight or to lose weight. Ask what an ideal weight is for you.  Get at least 30 minutes of exercise that causes your heart to beat faster (aerobic exercise) most days of the week. Activities may include walking, swimming, or biking.  Include exercise to strengthen your muscles (resistance exercise), such as pilates or lifting weights, as part of your weekly exercise routine. Try to do these types of exercises for 30 minutes at least 3 days a week.  Do not use any products that contain nicotine or tobacco, such as cigarettes and e-cigarettes. If you need help quitting, ask your health care provider.  Monitor your blood pressure at home as told by your health care provider.  Keep all follow-up visits as told by  your health care provider. This is important. Medicines  Take over-the-counter and prescription medicines only as told by your health care provider. Follow directions carefully. Blood pressure medicines must be taken as prescribed.  Do not skip doses of blood pressure medicine. Doing this puts you at risk for problems and can make the medicine less effective.  Ask your health care provider about side effects or reactions to medicines that you should watch for. Contact a health care provider if:  You think you are having a reaction to a medicine you are taking.  You have headaches that keep coming back (recurring).  You feel dizzy.  You have swelling in your ankles.  You have trouble with your vision. Get help right away if:  You develop a severe headache or confusion.  You have unusual weakness or numbness.  You feel faint.  You have severe pain in your chest or abdomen.  You vomit repeatedly.  You have trouble breathing. Summary  Hypertension is when the force of blood pumping through your arteries is too strong. If this condition is not controlled, it   may put you at risk for serious complications.  Your personal target blood pressure may vary depending on your medical conditions, your age, and other factors. For most people, a normal blood pressure is less than 120/80.  Hypertension is treated with lifestyle changes, medicines, or a combination of both. Lifestyle changes include weight loss, eating a healthy, low-sodium diet, exercising more, and limiting alcohol. This information is not intended to replace advice given to you by your health care provider. Make sure you discuss any questions you have with your health care provider. Document Released: 04/18/2005 Document Revised: 03/16/2016 Document Reviewed: 03/16/2016 Elsevier Interactive Patient Education  2019 [employer].   DASH Eating Plan DASH stands for "Dietary Approaches to Stop Hypertension." The DASH eating  plan is a healthy eating plan that has been shown to reduce high blood pressure (hypertension). It may also reduce your risk for type 2 diabetes, heart disease, and stroke. The DASH eating plan may also help with weight loss. What are tips for following this plan?  General guidelines  Avoid eating more than 2,300 mg (milligrams) of salt (sodium) a day. If you have hypertension, you may need to reduce your sodium intake to 1,500 mg a day.  Limit alcohol intake to no more than 1 drink a day for nonpregnant women and 2 drinks a day for men. One drink equals 12 oz of beer, 5 oz of wine, or 1 oz of hard liquor.  Work with your health care provider to maintain a healthy body weight or to lose weight. Ask what an ideal weight is for you.  Get at least 30 minutes of exercise that causes your heart to beat faster (aerobic exercise) most days of the week. Activities may include walking, swimming, or biking.  Work with your health care provider or diet and nutrition specialist (dietitian) to adjust your eating plan to your individual calorie needs. Reading food labels   Check food labels for the amount of sodium per serving. Choose foods with less than 5 percent of the Daily Value of sodium. Generally, foods with less than 300 mg of sodium per serving fit into this eating plan.  To find whole grains, look for the word "whole" as the first word in the ingredient list. Shopping  Buy products labeled as "low-sodium" or "no salt added."  Buy fresh foods. Avoid canned foods and premade or frozen meals. Cooking  Avoid adding salt when cooking. Use salt-free seasonings or herbs instead of table salt or sea salt. Check with your health care provider or pharmacist before using salt substitutes.  Do not fry foods. Cook foods using healthy methods such as baking, boiling, grilling, and broiling instead.  Cook with heart-healthy oils, such as olive, canola, soybean, or sunflower oil. Meal planning  Eat a  balanced diet that includes: ? 5 or more servings of fruits and vegetables each day. At each meal, try to fill half of your plate with fruits and vegetables. ? Up to 6-8 servings of whole grains each day. ? Less than 6 oz of lean meat, poultry, or fish each day. A 3-oz serving of meat is about the same size as a deck of cards. One egg equals 1 oz. ? 2 servings of low-fat dairy each day. ? A serving of nuts, seeds, or beans 5 times each week. ? Heart-healthy fats. Healthy fats called Omega-3 fatty acids are found in foods such as flaxseeds and coldwater fish, like sardines, salmon, and mackerel.  Limit how much you eat of  the following: ? Canned or prepackaged foods. ? Food that is high in trans fat, such as fried foods. ? Food that is high in saturated fat, such as fatty meat. ? Sweets, desserts, sugary drinks, and other foods with added sugar. ? Full-fat dairy products.  Do not salt foods before eating.  Try to eat at least 2 vegetarian meals each week.  Eat more home-cooked food and less restaurant, buffet, and fast food.  When eating at a restaurant, ask that your food be prepared with less salt or no salt, if possible. What foods are recommended? The items listed may not be a complete list. Talk with your dietitian about what dietary choices are best for you. Grains Whole-grain or whole-wheat bread. Whole-grain or whole-wheat pasta. Brown rice. Orpah Cobbatmeal. Quinoa. Bulgur. Whole-grain and low-sodium cereals. Pita bread. Low-fat, low-sodium crackers. Whole-wheat flour tortillas. Vegetables Fresh or frozen vegetables (raw, steamed, roasted, or grilled). Low-sodium or reduced-sodium tomato and vegetable juice. Low-sodium or reduced-sodium tomato sauce and tomato paste. Low-sodium or reduced-sodium canned vegetables. Fruits All fresh, dried, or frozen fruit. Canned fruit in natural juice (without added sugar). Meat and other protein foods Skinless chicken or Mal[place]urkey. Ground chicken or  [place]urkey. Pork with fat trimmed off. Fish and seafood. Egg whites. Dried beans, peas, or lentils. Unsalted nuts, nut butters, and seeds. Unsalted canned beans. Lean cuts of beef with fat trimmed off. Low-sodium, lean deli meat. Dairy Low-fat (1%) or fat-free (skim) milk. Fat-free, low-fat, or reduced-fat cheeses. Nonfat, low-sodium ricotta or cottage cheese. Low-fat or nonfat yogurt. Low-fat, low-sodium cheese. Fats and oils Soft margarine without trans fats. Vegetable oil. Low-fat, reduced-fat, or light mayonnaise and salad dressings (reduced-sodium). Canola, safflower, olive, soybean, and sunflower oils. Avocado. Seasoning and other foods Herbs. Spices. Seasoning mixes without salt. Unsalted popcorn and pretzels. Fat-free sweets. What foods are not recommended? The items listed may not be a complete list. Talk with your dietitian about what dietary choices are best for you. Grains Baked goods made with fat, such as croissants, muffins, or some breads. Dry pasta or rice meal packs. Vegetables Creamed or fried vegetables. Vegetables in a cheese sauce. Regular canned vegetables (not low-sodium or reduced-sodium). Regular canned tomato sauce and paste (not low-sodium or reduced-sodium). Regular tomato and vegetable juice (not low-sodium or reduced-sodium). Rosita FirePickles. Olives. Fruits Canned fruit in a light or heavy syrup. Fried fruit. Fruit in cream or butter sauce. Meat and other protein foods Fatty cuts of meat. Ribs. Fried meat. Tomasa BlaseBacon. Sausage. Bologna and other processed lunch meats. Salami. Fatback. Hotdogs. Bratwurst. Salted nuts and seeds. Canned beans with added salt. Canned or smoked fish. Whole eggs or egg yolks. Chicken or [place]urkey with skin. Dairy Whole or 2% milk, cream, and half-and-half. Whole or full-fat cream cheese. Whole-fat or sweetened yogurt. Full-fat cheese. Nondairy creamers. Whipped toppings. Processed cheese and cheese spreads. Fats and oils Butter. Stick margarine. Lard.  Shortening. Ghee. Bacon fat. Tropical oils, such as coconut, palm kernel, or palm oil. Seasoning and other foods Salted popcorn and pretzels. Onion salt, garlic salt, seasoned salt, table salt, and sea salt. Worcestershire sauce. Tartar sauce. Barbecue sauce. Teriyaki sauce. Soy sauce, including reduced-sodium. Steak sauce. Canned and packaged gravies. Fish sauce. Oyster sauce. Cocktail sauce. Horseradish that you find on the shelf. Ketchup. Mustard. Meat flavorings and tenderizers. Bouillon cubes. Hot sauce and Tabasco sauce. Premade or packaged marinades. Premade or packaged taco seasonings. Relishes. Regular salad dressings. Where to find more information:  National Heart, Lung, and Blood Institute: PopSteam.iswww.nhlbi.nih.gov  American Heart Association:  www.heart.org Summary  The DASH eating plan is a healthy eating plan that has been shown to reduce high blood pressure (hypertension). It may also reduce your risk for type 2 diabetes, heart disease, and stroke.  With the DASH eating plan, you should limit salt (sodium) intake to 2,300 mg a day. If you have hypertension, you may need to reduce your sodium intake to 1,500 mg a day.  When on the DASH eating plan, aim to eat more fresh fruits and vegetables, whole grains, lean proteins, low-fat dairy, and heart-healthy fats.  Work with your health care provider or diet and nutrition specialist (dietitian) to adjust your eating plan to your individual calorie needs. This information is not intended to replace advice given to you by your health care provider. Make sure you discuss any questions you have with your health care provider. Document Released: 04/07/2011 Document Revised: 04/11/2016 Document Reviewed: 04/11/2016 Elsevier Interactive Patient Education  2019 Elsevier Inc.  EXCELLENT BLOOD PRESSURE! Continue regular exercise and following DASH eating program. When you need refills on medications, please call pharmacy and they will contact  us. Follow-up Oct for complete physical, week prior please schedule fasting lab appt. GREAT TO SEE YOU!

## 2018-05-29 NOTE — Assessment & Plan Note (Signed)
EXCELLENT BLOOD PRESSURE! Continue regular exercise and following DASH eating program. When you need refills on medications, please call pharmacy and they will contact us. Follow-up Oct for complete physical, week prior please schedule fasting lab appt.

## 2018-05-29 NOTE — Assessment & Plan Note (Signed)
BP at goal 126/85, HR 62 Continue on HCTZ 25mg  QD Continue regular exercise and DASH eating

## 2018-06-12 ENCOUNTER — Telehealth: Payer: Self-pay | Admitting: Adult Health

## 2018-06-12 NOTE — Telephone Encounter (Signed)
Patient called states has been having pain w/ soreness in both legs and doesn't know what is causing it, he has not been to work or done any strenuous activity to injury or cause pain.Marland Kitchen.  --Forwarding message to medical assistant to call Pt at [phone].  --glh

## 2018-06-12 NOTE — Telephone Encounter (Signed)
Spoke with pt and advised he needs OV.  Pt is scheduled for 06/13/2018.  Pt expressed understanding and is agreeable.  Tiajuana Amass, CMA
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
## 2018-06-13 ENCOUNTER — Ambulatory Visit (INDEPENDENT_AMBULATORY_CARE_PROVIDER_SITE_OTHER): Payer: Managed Care, Other (non HMO) | Admitting: Adult Health

## 2018-06-13 ENCOUNTER — Encounter: Payer: Self-pay | Admitting: Adult Health

## 2018-06-13 VITALS — BP 93/69 | HR 75 | Temp 98.3°F | Ht 70.0 in | Wt 205.4 lb

## 2018-06-13 DIAGNOSIS — R29898 Other symptoms and signs involving the musculoskeletal system: Secondary | ICD-10-CM | POA: Diagnosis not present

## 2018-06-13 DIAGNOSIS — R200 Anesthesia of skin: Secondary | ICD-10-CM | POA: Diagnosis not present

## 2018-06-13 LAB — POCT GLYCOSYLATED HEMOGLOBIN (HGB A1C): HEMOGLOBIN A1C: 5.6 % (ref 4.0–5.6)

## 2018-06-13 MED ORDER — GABAPENTIN 100 MG PO CAPS: ORAL_CAPSULE | ORAL | 0 refills | Status: DC

## 2018-06-13 NOTE — Patient Instructions (Addendum)
Neuropathic Pain Neuropathic pain is pain caused by damage to the nerves that are responsible for certain sensations in your body (sensory nerves). The pain can be caused by:  Damage to the sensory nerves that send signals to your spinal cord and brain (peripheral nervous system).  Damage to the sensory nerves in your brain or spinal cord (central nervous system). Neuropathic pain can make you more sensitive to pain. Even a minor sensation can feel very painful. This is usually a long-term condition that can be difficult to treat. The type of pain differs from person to person. It may:  Start suddenly (acute), or it may develop slowly and last for a long time (chronic).  Come and go as damaged nerves heal, or it may stay at the same level for years.  Cause emotional distress, loss of sleep, and a lower quality of life. What are the causes? The most common cause of this condition is diabetes. Many other diseases and conditions can also cause neuropathic pain. Causes of neuropathic pain can be classified as:  Toxic. This is caused by medicines and chemicals. The most common cause of toxic neuropathic pain is damage from cancer treatments (chemotherapy).  Metabolic. This can be caused by: ? Diabetes. This is the most common disease that damages the nerves. ? Lack of vitamin B from long-term alcohol abuse.  Traumatic. Any injury that cuts, crushes, or stretches a nerve can cause damage and pain. A common example is feeling pain after losing an arm or leg (phantom limb pain).  Compression-related. If a sensory nerve gets trapped or compressed for a long period of time, the blood supply to the nerve can be cut off.  Vascular. Many blood vessel diseases can cause neuropathic pain by decreasing blood supply and oxygen to nerves.  Autoimmune. This type of pain results from diseases in which the body's defense system (immune system) mistakenly attacks sensory nerves. Examples of autoimmune diseases  that can cause neuropathic pain include lupus and multiple sclerosis.  Infectious. Many types of viral infections can damage sensory nerves and cause pain. Shingles infection is a common cause of this type of pain.  Inherited. Neuropathic pain can be a symptom of many diseases that are passed down through families (genetic). What increases the risk? You are more likely to develop this condition if:  You have diabetes.  You smoke.  You drink too much alcohol.  You are taking certain medicines, including medicines that kill cancer cells (chemotherapy) or that treat immune system disorders. What are the signs or symptoms? The main symptom is pain. Neuropathic pain is often described as:  Burning.  Shock-like.  Stinging.  Hot or cold.  Itching. How is this diagnosed? No single test can diagnose neuropathic pain. It is diagnosed based on:  Physical exam and your symptoms. Your health care provider will ask you about your pain. You may be asked to use a pain scale to describe how bad your pain is.  Tests. These may be done to see if you have a high sensitivity to pain and to help find the cause and location of any sensory nerve damage. They include: ? Nerve conduction studies to test how well nerve signals travel through your sensory nerves (electrodiagnostic testing). ? Stimulating your sensory nerves through electrodes on your skin and measuring the response in your spinal cord and brain (somatosensory evoked potential).  Imaging studies, such as: ? X-rays. ? CT scan. ? MRI. How is this treated? Treatment for neuropathic pain may change   over time. You may need to try different treatment options or a combination of treatments. Some options include:  Treating the underlying cause of the neuropathy, such as diabetes, kidney disease, or vitamin deficiencies.  Stopping medicines that can cause neuropathy, such as chemotherapy.  Medicine to relieve pain. Medicines may  include: ? Prescription or over-the-counter pain medicine. ? Anti-seizure medicine. ? Antidepressant medicines. ? Pain-relieving patches that are applied to painful areas of skin. ? A medicine to numb the area (local anesthetic), which can be injected as a nerve block.  Transcutaneous nerve stimulation. This uses electrical currents to block painful nerve signals. The treatment is painless.  Alternative treatments, such as: ? Acupuncture. ? Meditation. ? Massage. ? Physical therapy. ? Pain management programs. ? Counseling. Follow these instructions at home: Medicines   Take over-the-counter and prescription medicines only as told by your health care provider.  Do not drive or use heavy machinery while taking prescription pain medicine.  If you are taking prescription pain medicine, take actions to prevent or treat constipation. Your health care provider may recommend that you: ? Drink enough fluid to keep your urine pale yellow. ? Eat foods that are high in fiber, such as fresh fruits and vegetables, whole grains, and beans. ? Limit foods that are high in fat and processed sugars, such as fried or sweet foods. ? Take an over-the-counter or prescription medicine for constipation. Lifestyle   Have a good support system at home.  Consider joining a chronic pain support group.  Do not use any products that contain nicotine or tobacco, such as cigarettes and e-cigarettes. If you need help quitting, ask your health care provider.  Do not drink alcohol. General instructions  Learn as much as you can about your condition.  Work closely with all your health care providers to find the treatment plan that works best for you.  Ask your health care provider what activities are safe for you.  Keep all follow-up visits as told by your health care provider. This is important. Contact a health care provider if:  Your pain treatments are not working.  You are having side effects  from your medicines.  You are struggling with tiredness (fatigue), mood changes, depression, or anxiety. Summary  Neuropathic pain is pain caused by damage to the nerves that are responsible for certain sensations in your body (sensory nerves).  Neuropathic pain may come and go as damaged nerves heal, or it may stay at the same level for years.  Neuropathic pain is usually a long-term condition that can be difficult to treat. Consider joining a chronic pain support group. This information is not intended to replace advice given to you by your health care provider. Make sure you discuss any questions you have with your health care provider. Document Released: 01/14/2004 Document Revised: 05/05/2017 Document Reviewed: 05/05/2017 Elsevier Interactive Patient Education  2019 [employer]  A1c (average of your blood sugar over the last 3 months) is normal, so that is not causing your burning leg pain. Referral to Neurosurgeon placed, someone will call you to schedule an appt. Please use Gabapentin for pain- be aware, the medication can be sedation, so recommend that you first try at night. Recommend alternating OTC Acetaminophen and Ibuprofen for additional pain control. Activity as tolerated. FEEL BETTER!

## 2018-06-13 NOTE — Assessment & Plan Note (Signed)
A1c (average of your blood sugar over the last 3 months) is normal, so that is not causing your burning leg pain. Referral to Neurosurgeon placed, someone will call you to schedule an appt. Please use Gabapentin for pain- be aware, the medication can be sedation, so recommend that you first try at night. Recommend alternating OTC Acetaminophen and Ibuprofen for additional pain control. Activity as tolerated.

## 2018-07-31 ENCOUNTER — Institutional Professional Consult (permissible substitution): Payer: Managed Care, Other (non HMO) | Admitting: Neurology

## 2018-11-13 NOTE — Progress Notes (Signed)
Subjective:    Patient ID: Lance Long, male    DOB: [birth year], 53 y.o.   MRN: [id number]  HPI:  Lance Long presents with R knee pain and swelling that developed after twisting knee injury 6 days ago while he was out on a routine job. He reports stepping into a "pot hole" that was able 4-5 inches deep. He denies falling to the ground and was able to walk home. He denies pain at rest, reports significant pain with any movement or standing. Pain rated 10/10, he is unable to describe the pain, but stats "It will make you want to crawl up into a ball". He denies previous R knee injury. He reports difficulty with ambulation, denies instability. He denies numbness/tingling in R foot He has used OTC Ibuprofen and Icy Hot- only minimal sx relief.  Patient Care Team    Relationship Specialty Notifications Start End  Lance Grandchild, NP PCP - General Family Medicine  12/19/17     Patient Active Problem List   Diagnosis Date Noted  . Acute pain of right knee 11/14/2018  . Leg numbness 06/13/2018  . Leg weakness, bilateral 06/13/2018  . Erectile dysfunction 02/26/2018  . Chronic migraine 02/06/2018  . Obstructive sleep apnea 02/06/2018  . Migraine 12/19/2017  . Healthcare maintenance 12/19/2017  . OSA (obstructive sleep apnea) 10/29/2014  . Benign essential HTN 10/29/2014  . Obesity (BMI 30-39.9) 10/29/2014  . Closed fracture of metatarsal bone(s) 02/12/2013     Past Medical History:  Diagnosis Date  . Folliculitis barbae   . Hypertension   . Migraine   . OSA (obstructive sleep apnea)   . Sleep apnea    does not use c-pap machine     Past Surgical History:  Procedure Laterality Date  . FOOT SURGERY Left 11.22.13  . TMJ JAW  08/1998     Family History  Problem Relation Age of Onset  . Hypertension Mother   . Fibromyalgia Mother   . Diabetes Mother   . Liver cancer Father   . Heart attack Maternal Grandmother   . Diabetes Maternal Grandmother   . Hypertension  Maternal Grandmother   . Stroke Maternal Grandmother   . Liver disease Maternal Grandfather   . Healthy Sister   . Healthy Brother   . Colon cancer Neg Hx   . Colon polyps Neg Hx   . Esophageal cancer Neg Hx   . Rectal cancer Neg Hx   . Stomach cancer Neg Hx      Social History   Substance and Sexual Activity  Drug Use No     Social History   Substance and Sexual Activity  Alcohol Use Yes  . Alcohol/week: 2.0 standard drinks  . Types: 2 Glasses of wine per week   Comment: occasional     Social History   Tobacco Use  Smoking Status Never Smoker  Smokeless Tobacco Never Used     Outpatient Encounter Medications as of 11/14/2018  Medication Sig  . hydrochlorothiazide (HYDRODIURIL) 25 MG tablet Take 1 tablet (25 mg total) by mouth daily.  . Multiple Vitamin (MULTI VITAMIN DAILY) TABS Take by mouth daily.  . sildenafil (VIAGRA) 50 MG tablet TAKE 1/2 TO 1 TABLET 30MINS TO 4 HOURS PRIOR TO SEXUAL ACTIVITY  . SUMAtriptan (IMITREX) 100 MG tablet Take 1 tablet (100 mg total) by mouth every 2 (two) hours as needed for migraine. May repeat in 2 hours if headache persists or recurs.  Marland Kitchen oxycodone-acetaminophen (LYNOX) 5-300 MG tablet Take  1 tablet by mouth every 6 (six) hours as needed for up to 3 days for pain.  . [DISCONTINUED] gabapentin (NEURONTIN) 100 MG capsule 1 capsules every 8 hrs as needed for burning pain- may cause sedation so recommend trying at night first   No facility-administered encounter medications on file as of 11/14/2018.     Allergies: Oxycodone-acetaminophen  Body mass index is 31.64 kg/m.  Blood pressure 138/72, pulse 68, temperature 98.8 F (37.1 C), temperature source Oral, height 5\' 10"  (1.778 m), weight 220 lb 8 oz (100 kg), SpO2 98 %.     Review of Systems  Constitutional: Positive for fatigue. Negative for activity change, appetite change, chills, diaphoresis, fever and unexpected weight change.  Respiratory: Negative for cough, chest  tightness, shortness of breath, wheezing and stridor.   Cardiovascular: Negative for chest pain, palpitations and leg swelling.  Musculoskeletal: Positive for arthralgias, gait problem, joint swelling and myalgias.  Hematological: Negative for adenopathy. Does not bruise/bleed easily.       Objective:   Physical Exam Vitals signs and nursing note reviewed.  Constitutional:      General: He is not in acute distress.    Appearance: Normal appearance. He is obese. He is not ill-appearing, toxic-appearing or diaphoretic.  Musculoskeletal:        General: Swelling and tenderness present. No deformity or signs of injury.     Right hip: Normal.     Right knee: He exhibits decreased range of motion and swelling. He exhibits no laceration, no erythema, normal alignment, no LCL laxity, normal patellar mobility, no bony tenderness, normal meniscus and no MCL laxity. Tenderness found. Medial joint line and patellar tendon tenderness noted.     Left knee: Normal.     Right ankle: Normal.  Skin:    General: Skin is warm and dry.     Capillary Refill: Capillary refill takes less than 2 seconds.  Neurological:     Mental Status: He is alert and oriented to person, place, and time.  Psychiatric:        Mood and Affect: Mood normal.        Behavior: Behavior normal.        Thought Content: Thought content normal.        Judgment: Judgment normal.        Assessment & Plan:   1. Acute pain of right knee     Acute pain of right knee Xray-  Use your crutches- known weight bearing until you are evaluated by Orthopedic Specialist. Use OTC Ace Wrap for swelling and stability. Alternate OTC Acetaminophen and Ibuprofen for mild-moderate pain. [place] Controlled Substance Database reviewed-no aberrancies noted.  Use Oxycodone 5mg  every 6 hrs as needed for severe pain- do not drive or use alcohol when taking. ICE ICE ICE-  ? Put ice in a plastic bag. ? Place a towel between your skin and  the bag. ? Leave the ice on for 20 minutes, 2-3 times a day.     FOLLOW-UP:  Return if symptoms worsen or fail to improve.

## 2018-11-14 ENCOUNTER — Ambulatory Visit (INDEPENDENT_AMBULATORY_CARE_PROVIDER_SITE_OTHER): Payer: BC Managed Care – PPO | Admitting: Adult Health

## 2018-11-14 ENCOUNTER — Other Ambulatory Visit: Payer: Self-pay

## 2018-11-14 ENCOUNTER — Encounter: Payer: Self-pay | Admitting: Adult Health

## 2018-11-14 ENCOUNTER — Ambulatory Visit: Payer: BC Managed Care – PPO

## 2018-11-14 VITALS — BP 138/72 | HR 68 | Temp 98.8°F | Ht 70.0 in | Wt 220.5 lb

## 2018-11-14 DIAGNOSIS — M25561 Pain in right knee: Secondary | ICD-10-CM | POA: Diagnosis not present

## 2018-11-14 MED ORDER — OXYCODONE-ACETAMINOPHEN 5-300 MG PO TABS: 1.0000 | ORAL_TABLET | Freq: Four times a day (QID) | ORAL | 0 refills | Status: AC | PRN

## 2018-11-14 NOTE — Patient Instructions (Signed)
Acute Knee Pain, Adult Acute knee pain is sudden and may be caused by damage, swelling, or irritation of the muscles and tissues that support your knee. The injury may result from:  A fall.  An injury to your knee from twisting motions.  A hit to the knee.  Infection. Acute knee pain may go away on its own with time and rest. If it does not, your health care provider may order tests to find the cause of the pain. These may include:  Imaging tests, such as an X-ray, MRI, or ultrasound.  Joint aspiration. In this test, fluid is removed from the knee.  Arthroscopy. In this test, a lighted tube is inserted into the knee and an image is projected onto a TV screen.  Biopsy. In this test, a sample of tissue is removed from the body and studied under a microscope. Follow these instructions at home: Pay attention to any changes in your symptoms. Take these actions to relieve your pain. If you have a knee sleeve or brace:   Wear the sleeve or brace as told by your health care provider. Remove it only as told by your health care provider.  Loosen the sleeve or brace if your toes tingle, become numb, or turn cold and blue.  Keep the sleeve or brace clean.  If the sleeve or brace is not waterproof: ? Do not let it get wet. ? Cover it with a watertight covering when you take a bath or shower. Activity  Rest your knee.  Do not do things that cause pain or make pain worse.  Avoid high-impact activities or exercises, such as running, jumping rope, or doing jumping jacks.  Work with a physical therapist to make a safe exercise program, as recommended by your health care provider. Do exercises as told by your physical therapist. Managing pain, stiffness, and swelling   If directed, put ice on the knee: ? Put ice in a plastic bag. ? Place a towel between your skin and the bag. ? Leave the ice on for 20 minutes, 2-3 times a day.  If directed, use an elastic bandage to put pressure  (compression) on your injured knee. This may control swelling, give support, and help with discomfort. General instructions  Take over-the-counter and prescription medicines only as told by your health care provider.  Raise (elevate) your knee above the level of your heart when you are sitting or lying down.  Sleep with a pillow under your knee.  Do not use any products that contain nicotine or tobacco, such as cigarettes, e-cigarettes, and chewing tobacco. These can delay healing. If you need help quitting, ask your health care provider.  If you are overweight, work with your health care provider and a dietitian to set a weight-loss goal that is healthy and reasonable for you. Extra weight can put pressure on your knee.  Keep all follow-up visits as told by your health care provider. This is important. Contact a health care provider if:  Your knee pain continues, changes, or gets worse.  You have a fever along with knee pain.  Your knee feels warm to the touch.  Your knee buckles or locks up. Get help right away if:  Your knee swells, and the swelling becomes worse.  You cannot move your knee.  You have severe pain in your knee. Summary  Acute knee pain can be caused by a fall, an injury, an infection, or damage, swelling, or irritation of the tissues that support your knee.    Your health care provider may perform tests to find out the cause of the pain.  Pay attention to any changes in your symptoms. Relieve your pain with rest, medicines, light activity, and use of ice.  Get help if your pain continues or becomes worse, your knee swells, or you cannot move your knee. This information is not intended to replace advice given to you by your health care provider. Make sure you discuss any questions you have with your health care provider. Document Released: 02/13/2007 Document Revised: 09/28/2017 Document Reviewed: 09/28/2017 Elsevier Patient Education  2020 [employer].   Use your crutches- known weight bearing until you are evaluated by Orthopedic Specialist. Use OTC Ace Wrap for swelling and stability. Alternate OTC Acetaminophen and Ibuprofen for mild-moderate pain. Use Oxycodone 5mg  every 6 hrs as needed for severe pain- do not drive or use alcohol when taking. ICE ICE ICE-  ? Put ice in a plastic bag. ? Place a towel between your skin and the bag. ? Leave the ice on for 20 minutes, 2-3 times a day.  Continue to social distance and wear a mask when in public. FEEL BETTER!

## 2018-11-14 NOTE — Assessment & Plan Note (Signed)
Xray-  Use your crutches- known weight bearing until you are evaluated by Orthopedic Specialist. Use OTC Ace Wrap for swelling and stability. Alternate OTC Acetaminophen and Ibuprofen for mild-moderate pain. [place] Controlled Substance Database reviewed-no aberrancies noted.  Use Oxycodone 5mg  every 6 hrs as needed for severe pain- do not drive or use alcohol when taking. ICE ICE ICE-  ? Put ice in a plastic bag. ? Place a towel between your skin and the bag. ? Leave the ice on for 20 minutes, 2-3 times a day.

## 2018-11-16 ENCOUNTER — Ambulatory Visit (INDEPENDENT_AMBULATORY_CARE_PROVIDER_SITE_OTHER): Payer: BC Managed Care – PPO | Admitting: Orthopaedic Surgery

## 2018-11-16 ENCOUNTER — Encounter: Payer: Self-pay | Admitting: Physician Assistant

## 2018-11-16 ENCOUNTER — Other Ambulatory Visit: Payer: Self-pay

## 2018-11-16 DIAGNOSIS — M25561 Pain in right knee: Secondary | ICD-10-CM

## 2018-11-16 MED ORDER — METHYLPREDNISOLONE ACETATE 40 MG/ML IJ SUSP
40.0000 mg | INTRAMUSCULAR | Status: AC | PRN
  Administered 2018-11-16: 09:00:00 40 mg via INTRA_ARTICULAR

## 2018-11-16 MED ORDER — LIDOCAINE HCL 1 % IJ SOLN
2.0000 mL | INTRAMUSCULAR | Status: AC | PRN
  Administered 2018-11-16: 09:00:00 2 mL

## 2018-11-16 MED ORDER — BUPIVACAINE HCL 0.25 % IJ SOLN
2.0000 mL | INTRAMUSCULAR | Status: AC | PRN
  Administered 2018-11-16: 09:00:00 2 mL via INTRA_ARTICULAR

## 2018-11-16 NOTE — Progress Notes (Signed)
Office Visit Note   Patient: Lance Long           Date of Birth: 11/19/1965           MRN: [id number] Visit Date: 11/16/2018              Requested by: Julaine Fusianford, [name] D, NP [address] [place],  [place] [id number] PCP: Julaine Fusianford, [name] D, NP   Assessment & Plan: Visit Diagnoses:  1. Acute pain of right knee     Plan: Impression is right knee medial meniscus tear.  Injected the right knee with cortisone today.  He will give this at least 2 weeks and if he is not any better, he will let us know we will get an MRI of the right knee to further assess internal structures.  He will call with concerns or questions the meantime.  Follow-Up Instructions: Return if symptoms worsen or fail to improve.   Orders:  Orders Placed This Encounter  Procedures  . Large Joint Inj: R knee   No orders of the defined types were placed in this encounter.     Procedures: Large Joint Inj: R knee on 11/16/2018 8:48 AM Indications: pain Details: 22 G needle, anterolateral approach Medications: 2 mL bupivacaine 0.25 %; 2 mL lidocaine 1 %; 40 mg methylPREDNISolone acetate 40 MG/ML      Clinical Data: No additional findings.   Subjective: Chief Complaint  Patient presents with  . Right Knee - Injury, Pain    HPI patient is a pleasant 53 year old gentleman who presents our clinic today with right knee pain.  This began about a week ago when he was running and stepped in a hole causing his right knee to buckle.  Since then he has had moderate pain which is not improved.  He has had intermittent swelling as well.  The pain he has is to the medial aspect of the knee.  He describes this as a constant ache with associated sharp shooting pains with certain motions.  Increased pain when he is walking for a period of time.  He denies any locking or catching.  He has used ice, Tylenol and has been elevating his knee with minimal improvement of symptoms.  No previous injury or pain to the right knee.  He  was seen by his PCP a few days ago where x-rays were obtained.  He is were negative for fracture or other acute findings.  No previous cortisone injection to the right knee.  Review of Systems as detailed in HPI.  All others reviewed and are negative.   Objective: Vital Signs: There were no vitals taken for this visit.  Physical Exam well-developed well-nourished gentleman in no acute distress.  Alert oriented x3.  Ortho Exam examination of the right knee shows no effusion.  Range of motion 0 to 95 degrees.  Marked tenderness medial joint line.  Minimally positive [place].  He is stable to valgus and varus stress.  Negative anterior drawer.  He is able to fully straight leg raise.  He is neurovascular intact distally.  Specialty Comments:  No specialty comments available.  Imaging: No new imaging   PMFS History: Patient Active Problem List   Diagnosis Date Noted  . Acute pain of right knee 11/14/2018  . Leg numbness 06/13/2018  . Leg weakness, bilateral 06/13/2018  . Erectile dysfunction 02/26/2018  . Chronic migraine 02/06/2018  . Obstructive sleep apnea 02/06/2018  . Migraine 12/19/2017  . Healthcare maintenance 12/19/2017  . OSA (  obstructive sleep apnea) 10/29/2014  . Benign essential HTN 10/29/2014  . Obesity (BMI 30-39.9) 10/29/2014  . Closed fracture of metatarsal bone(s) 02/12/2013   Past Medical History:  Diagnosis Date  . Folliculitis barbae   . Hypertension   . Migraine   . OSA (obstructive sleep apnea)   . Sleep apnea    does not use c-pap machine    Family History  Problem Relation Age of Onset  . Hypertension Mother   . Fibromyalgia Mother   . Diabetes Mother   . Liver cancer Father   . Heart attack Maternal Grandmother   . Diabetes Maternal Grandmother   . Hypertension Maternal Grandmother   . Stroke Maternal Grandmother   . Liver disease Maternal Grandfather   . Healthy Sister   . Healthy Brother   . Colon cancer Neg Hx   . Colon polyps Neg Hx    . Esophageal cancer Neg Hx   . Rectal cancer Neg Hx   . Stomach cancer Neg Hx     Past Surgical History:  Procedure Laterality Date  . FOOT SURGERY Left 11.22.13  . TMJ JAW  08/1998   Social History   Occupational History  . Not on file  Tobacco Use  . Smoking status: Never Smoker  . Smokeless tobacco: Never Used  Substance and Sexual Activity  . Alcohol use: Yes    Alcohol/week: 2.0 standard drinks    Types: 2 Glasses of wine per week    Comment: occasional  . Drug use: No  . Sexual activity: Not Currently

## 2018-11-18 ENCOUNTER — Other Ambulatory Visit: Payer: Self-pay | Admitting: Adult Health

## 2018-11-19 ENCOUNTER — Telehealth: Payer: Self-pay | Admitting: Adult Health

## 2018-11-19 NOTE — Telephone Encounter (Signed)
See below

## 2018-11-19 NOTE — Telephone Encounter (Signed)
Please advise.  T. Nelson, CMA 

## 2018-11-19 NOTE — Telephone Encounter (Signed)
Patient called and said his pharmacy just let him know that the pain med we sent in last week for him is a special order and that they were recommending a different pain med that they have in stock. Patient could not recall the med we prescribed nor the med that was offered as an alternative. Please advise

## 2018-11-19 NOTE — Telephone Encounter (Signed)
Need faxed message from pharmacy saying as such and that the pt did not pick up the rx Thanks! Valetta Fuller

## 2018-11-20 ENCOUNTER — Other Ambulatory Visit: Payer: Self-pay | Admitting: Physician Assistant

## 2018-11-20 ENCOUNTER — Telehealth: Payer: Self-pay | Admitting: Orthopaedic Surgery

## 2018-11-20 DIAGNOSIS — M25561 Pain in right knee: Secondary | ICD-10-CM

## 2018-11-20 MED ORDER — HYDROCODONE-ACETAMINOPHEN 5-325 MG PO TABS: 1.0000 | ORAL_TABLET | Freq: Every day | ORAL | 0 refills | Status: DC | PRN

## 2018-11-20 NOTE — Telephone Encounter (Signed)
Patient called advised his right knee is not any better and the pain is really bad. Patient said he is suppose to go back to work today. The number to contact patient is [phone]

## 2018-11-20 NOTE — Telephone Encounter (Signed)
I spoke with patient. He did want to proceed with MRI scan so I did put in order for this. However, he is still saying that he needs to know what to do about work. 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

## 2018-11-20 NOTE — Telephone Encounter (Signed)
Tried calling pt to discuss No answer. LMVM for him to [facility] to further discuss below per [place].

## 2018-11-20 NOTE — Telephone Encounter (Signed)
We can write a note for desk work or light duty only.  We can also go ahead and get MRI if the pain is that bad

## 2018-11-20 NOTE — Telephone Encounter (Signed)
Please advise. thx 

## 2018-11-20 NOTE — Telephone Encounter (Signed)
Documentation from pharmacy received indicating that pt did not pick up this RX and placed in your basket.  Please review and send new RX if appropriate.  I did cancel this RX with the pharmacy.  Charyl Bigger, CMA

## 2018-11-20 NOTE — Telephone Encounter (Signed)
He was seen/treated by Ortho He should not need narcotics Thanks [name]

## 2018-11-20 NOTE — Telephone Encounter (Signed)
Pt states that although he had an injection 5 days ago, he is still having a significant amount of pain, especially at night.  Advised pt that he needs to call ortho since they are now treating this condition to see what advice they may have.  Pt expressed understanding and is agreeable.  Charyl Bigger, CMA

## 2018-11-20 NOTE — Telephone Encounter (Signed)
I have called in one small rx for norco.  Ok to extend work note one more week.  If still in this much pain next week,  we need to get MRI.  Will you make sure he is not having any new calf pain?

## 2018-11-21 ENCOUNTER — Telehealth: Payer: Self-pay | Admitting: Orthopedic Surgery

## 2018-11-21 NOTE — Telephone Encounter (Signed)
See other message Returned call to pt.

## 2018-11-21 NOTE — Telephone Encounter (Signed)
Tried calling patient to discuss. No answer. LMVM for him to [facility] 

## 2018-11-21 NOTE — Telephone Encounter (Signed)
Ok, if he is hurting that bad, I am thinking it may actually be best to come in and see [place]

## 2018-11-21 NOTE — Telephone Encounter (Signed)
Appt scheduled

## 2018-11-21 NOTE — Telephone Encounter (Signed)
Patient returned call asked for a call back. The number to contact patient is [phone]

## 2018-11-21 NOTE — Telephone Encounter (Signed)
Letter put up front for patient. I talked with him about calf pain. He said that he is having pain from distal part of thigh down into his knee and proximal calf.  He denied any shortness of breath or extreme calf pain or tenderness.

## 2018-11-23 ENCOUNTER — Encounter: Payer: Self-pay | Admitting: Physician Assistant

## 2018-11-23 ENCOUNTER — Ambulatory Visit (INDEPENDENT_AMBULATORY_CARE_PROVIDER_SITE_OTHER): Payer: BC Managed Care – PPO | Admitting: Orthopaedic Surgery

## 2018-11-23 DIAGNOSIS — M84361D Stress fracture, right tibia, subsequent encounter for fracture with routine healing: Secondary | ICD-10-CM

## 2018-11-23 NOTE — Progress Notes (Addendum)
Office Visit Note   Patient: Lance Long           Date of Birth: 1965/08/30           MRN: [id number] Visit Date: 11/23/2018              Requested by: Esaw Grandchild, NP [place],  [place] [address] PCP: Esaw Grandchild, NP   Assessment & Plan: Visit Diagnoses:  1. Stress fracture of right tibia with routine healing, subsequent encounter     Plan: Impression is questionable right knee bucket-handle medial meniscus tear.  We will get an urgent MRI to further assess this.  He will follow-up with [place] once that has been completed.  He will call with concerns or questions in meantime.  Follow-Up Instructions: Return for after MRI.   Orders:  No orders of the defined types were placed in this encounter.  No orders of the defined types were placed in this encounter.     Procedures: No procedures performed   Clinical Data: No additional findings.   Subjective: Chief Complaint  Patient presents with  . Left Knee - Follow-up, Pain    HPI Lance Long is a pleasant 53 year old gentleman who presents our clinic today with continued right knee pain.  I saw him a little over a week ago for this after stepping in a hole and twisting his knee.  I injected his knee with cortisone.  He had slight relief during the immediate anesthetic phase, but nothing more as of yet.  He describes a sharp shooting pain medial aspect which occurs not only with certain motions of the knee but when he is trying to sleep at night.  This is starting to affect his ADLs as well as his work life.  He denies any calf pain.  No chest pain and no shortness of breath.  No history of DVTs.  His MRIs not scheduled for another 3 to 4 weeks.  Review of Systems as detailed in HPI.  All others reviewed and are negative.   Objective: Vital Signs: There were no vitals taken for this visit.  Physical Exam well-developed well-nourished gentleman in no acute distress.  Alert and oriented x3.  Ortho Exam  examination of the right knee shows a trace effusion.  Range of motion 0 to 95 degrees.  Marked medial joint line tenderness.  Stable to valgus varus stress.  He is neurovascular intact distally.  Specialty Comments:  No specialty comments available.  Imaging: No new imaging   PMFS History: Patient Active Problem List   Diagnosis Date Noted  . Acute pain of right knee 11/14/2018  . Leg numbness 06/13/2018  . Leg weakness, bilateral 06/13/2018  . Erectile dysfunction 02/26/2018  . Chronic migraine 02/06/2018  . Obstructive sleep apnea 02/06/2018  . Migraine 12/19/2017  . Healthcare maintenance 12/19/2017  . OSA (obstructive sleep apnea) 10/29/2014  . Benign essential HTN 10/29/2014  . Obesity (BMI 30-39.9) 10/29/2014  . Closed fracture of metatarsal bone(s) 02/12/2013   Past Medical History:  Diagnosis Date  . Folliculitis barbae   . Hypertension   . Migraine   . OSA (obstructive sleep apnea)   . Sleep apnea    does not use c-pap machine    Family History  Problem Relation Age of Onset  . Hypertension Mother   . Fibromyalgia Mother   . Diabetes Mother   . Liver cancer Father   . Heart attack Maternal Grandmother   . Diabetes Maternal  Grandmother   . Hypertension Maternal Grandmother   . Stroke Maternal Grandmother   . Liver disease Maternal Grandfather   . Healthy Sister   . Healthy Brother   . Colon cancer Neg Hx   . Colon polyps Neg Hx   . Esophageal cancer Neg Hx   . Rectal cancer Neg Hx   . Stomach cancer Neg Hx     Past Surgical History:  Procedure Laterality Date  . FOOT SURGERY Left 11.22.13  . TMJ JAW  08/1998   Social History   Occupational History  . Not on file  Tobacco Use  . Smoking status: Never Smoker  . Smokeless tobacco: Never Used  Substance and Sexual Activity  . Alcohol use: Yes    Alcohol/week: 2.0 standard drinks    Types: 2 Glasses of wine per week    Comment: occasional  . Drug use: No  . Sexual activity: Not Currently

## 2018-12-19 ENCOUNTER — Other Ambulatory Visit: Payer: Self-pay

## 2018-12-19 ENCOUNTER — Telehealth: Payer: Self-pay | Admitting: Radiology

## 2018-12-19 ENCOUNTER — Ambulatory Visit
Admission: RE | Admit: 2018-12-19 | Discharge: 2018-12-19 | Disposition: A | Discharge status: Home / Self Care | Payer: BC Managed Care – PPO | Source: Ambulatory Visit | Attending: Physician Assistant | Admitting: Physician Assistant

## 2018-12-19 DIAGNOSIS — M25561 Pain in right knee: Secondary | ICD-10-CM

## 2018-12-19 DIAGNOSIS — S82191A Other fracture of upper end of right tibia, initial encounter for closed fracture: Secondary | ICD-10-CM | POA: Diagnosis not present

## 2018-12-19 MED ORDER — IBUPROFEN 800 MG PO TABS: 800.0000 mg | ORAL_TABLET | Freq: Three times a day (TID) | ORAL | 2 refills | Status: AC | PRN

## 2018-12-19 MED ORDER — HYDROCODONE-ACETAMINOPHEN 5-325 MG PO TABS: 1.0000 | ORAL_TABLET | Freq: Every day | ORAL | 0 refills | Status: DC | PRN

## 2018-12-19 NOTE — Telephone Encounter (Signed)
Spoke with patient about MRI results.  I would like to put him on sit down work for 6 weeks.  He'll come in Thursday to pick up note.  He should f/u in 6 weeks as well if someone can make him an appt.  Thanks.

## 2018-12-19 NOTE — Telephone Encounter (Signed)
Anderson Malta from [facility] called for Mendel Ryder or Dr. Erlinda Hong to be aware MRI Right knee report is ready, Radiologist wanted noted there is a tibial fracture.

## 2018-12-20 ENCOUNTER — Encounter: Payer: Self-pay | Admitting: Radiology

## 2018-12-20 ENCOUNTER — Telehealth: Payer: Self-pay | Admitting: Orthopaedic Surgery

## 2018-12-20 NOTE — Telephone Encounter (Signed)
Can one of you guys call and make appointment?

## 2018-12-20 NOTE — Telephone Encounter (Signed)
IC s/w patient and advised.  appt scheduled.

## 2018-12-20 NOTE — Telephone Encounter (Signed)
Work note printed. Placed at front desk.

## 2018-12-20 NOTE — Telephone Encounter (Signed)
Does patient need brace?

## 2018-12-20 NOTE — Telephone Encounter (Signed)
Patient came into pick up his work note and wanted to know if he suppose to wear a knee brace.  CB#[phone]

## 2018-12-21 NOTE — Telephone Encounter (Signed)
Can you arrange patient to get a medial unloader brace from [employer]?

## 2018-12-21 NOTE — Telephone Encounter (Signed)
Did you want an unloader brace or anything else?

## 2018-12-21 NOTE — Telephone Encounter (Signed)
Yeah that would be great.  Thanks.

## 2018-12-21 NOTE — Telephone Encounter (Signed)
Patient advised. Emailed Ryan, rep with [employer] about getting patient set up.

## 2019-01-31 ENCOUNTER — Encounter: Payer: Self-pay | Admitting: Orthopaedic Surgery

## 2019-01-31 ENCOUNTER — Ambulatory Visit (INDEPENDENT_AMBULATORY_CARE_PROVIDER_SITE_OTHER): Payer: BC Managed Care – PPO | Admitting: Orthopaedic Surgery

## 2019-01-31 DIAGNOSIS — M84361D Stress fracture, right tibia, subsequent encounter for fracture with routine healing: Secondary | ICD-10-CM | POA: Diagnosis not present

## 2019-01-31 NOTE — Progress Notes (Signed)
Office Visit Note   Patient: Lance Long           Date of Birth: [birth year]           MRN: [id number] Visit Date: 01/31/2019              Requested by: Julaine Fusi, NP [address] [place],  [place] [address] PCP: Julaine Fusi, NP   Assessment & Plan: Visit Diagnoses:  1. Stress fracture of right tibia with routine healing, subsequent encounter     Plan: Impression is healed proximal tibia stress fracture right knee.  Patient is doing well.  He will advance with activity as tolerated.  He will follow-up with [place] as needed.  Call with concerns or questions.  Follow-Up Instructions: Return if symptoms worsen or fail to improve.   Orders:  No orders of the defined types were placed in this encounter.  No orders of the defined types were placed in this encounter.     Procedures: No procedures performed   Clinical Data: No additional findings.   Subjective: Chief Complaint  Patient presents with  . Right Knee - Pain    HPI [name] is a pleasant 53 year old gentleman who presents our clinic today for follow-up of his right knee.  He is status post proximal medial tibial fracture from over 2-1/2 months ago.  He is doing very well.  He has since finished wearing his DJD unloader brace as he does not feel as though he needs it.  He denies any pain to the right knee.  He has full range of motion and strength.  No complaints.    Objective: Vital Signs: There were no vitals taken for this visit.    Ortho Exam examination of the right knee reveals no effusion.  No tenderness to the tibia.  Full active range of motion without pain.  He is neurovascular intact distally.  Specialty Comments:  No specialty comments available.  Imaging: No new imaging   PMFS History: Patient Active Problem List   Diagnosis Date Noted  . Acute pain of right knee 11/14/2018  . Leg numbness 06/13/2018  . Leg weakness, bilateral 06/13/2018  . Erectile dysfunction 02/26/2018  .  Chronic migraine 02/06/2018  . Obstructive sleep apnea 02/06/2018  . Migraine 12/19/2017  . Healthcare maintenance 12/19/2017  . OSA (obstructive sleep apnea) 10/29/2014  . Benign essential HTN 10/29/2014  . Obesity (BMI 30-39.9) 10/29/2014  . Closed fracture of metatarsal bone(s) 02/12/2013   Past Medical History:  Diagnosis Date  . Folliculitis barbae   . Hypertension   . Migraine   . OSA (obstructive sleep apnea)   . Sleep apnea    does not use c-pap machine    Family History  Problem Relation Age of Onset  . Hypertension Mother   . Fibromyalgia Mother   . Diabetes Mother   . Liver cancer Father   . Heart attack Maternal Grandmother   . Diabetes Maternal Grandmother   . Hypertension Maternal Grandmother   . Stroke Maternal Grandmother   . Liver disease Maternal Grandfather   . Healthy Sister   . Healthy Brother   . Colon cancer Neg Hx   . Colon polyps Neg Hx   . Esophageal cancer Neg Hx   . Rectal cancer Neg Hx   . Stomach cancer Neg Hx     Past Surgical History:  Procedure Laterality Date  . FOOT SURGERY Left 11.22.13  . TMJ JAW  08/1998   Social  History   Occupational History  . Not on file  Tobacco Use  . Smoking status: Never Smoker  . Smokeless tobacco: Never Used  Substance and Sexual Activity  . Alcohol use: Yes    Alcohol/week: 2.0 standard drinks    Types: 2 Glasses of wine per week    Comment: occasional  . Drug use: No  . Sexual activity: Not Currently

## 2019-03-05 ENCOUNTER — Other Ambulatory Visit: Payer: BC Managed Care – PPO

## 2019-03-05 ENCOUNTER — Other Ambulatory Visit: Payer: Self-pay

## 2019-03-05 DIAGNOSIS — Z0000 Encounter for general adult medical examination without abnormal findings: Secondary | ICD-10-CM

## 2019-03-05 DIAGNOSIS — I10 Essential (primary) hypertension: Secondary | ICD-10-CM | POA: Diagnosis not present

## 2019-03-06 LAB — CBC WITH DIFFERENTIAL/PLATELET
Basophils Absolute: 0.1 10*3/uL (ref 0.0–0.2)
Basos: 1 %
EOS (ABSOLUTE): 0.1 10*3/uL (ref 0.0–0.4)
Eos: 1 %
Hematocrit: 45.5 % (ref 37.5–51.0)
Hemoglobin: 15.8 g/dL (ref 13.0–17.7)
Immature Grans (Abs): 0 10*3/uL (ref 0.0–0.1)
Immature Granulocytes: 0 %
Lymphocytes Absolute: 2.4 10*3/uL (ref 0.7–3.1)
Lymphs: 35 %
MCH: 29.9 pg (ref 26.6–33.0)
MCHC: 34.7 g/dL (ref 31.5–35.7)
MCV: 86 fL (ref 79–97)
Monocytes Absolute: 0.9 10*3/uL (ref 0.1–0.9)
Monocytes: 13 %
Neutrophils Absolute: 3.4 10*3/uL (ref 1.4–7.0)
Neutrophils: 50 %
Platelets: 278 10*3/uL (ref 150–450)
RBC: 5.29 x10E6/uL (ref 4.14–5.80)
RDW: 13.1 % (ref 11.6–15.4)
WBC: 6.8 10*3/uL (ref 3.4–10.8)

## 2019-03-06 LAB — COMPREHENSIVE METABOLIC PANEL
ALT: 39 IU/L (ref 0–44)
AST: 59 IU/L — ABNORMAL HIGH (ref 0–40)
Albumin/Globulin Ratio: 1.5 (ref 1.2–2.2)
Albumin: 4.3 g/dL (ref 3.8–4.9)
Alkaline Phosphatase: 77 IU/L (ref 39–117)
BUN/Creatinine Ratio: 9 (ref 9–20)
BUN: 10 mg/dL (ref 6–24)
Bilirubin Total: 0.4 mg/dL (ref 0.0–1.2)
CO2: 29 mmol/L (ref 20–29)
Calcium: 9.7 mg/dL (ref 8.7–10.2)
Chloride: 98 mmol/L (ref 96–106)
Creatinine, Ser: 1.16 mg/dL (ref 0.76–1.27)
GFR calc Af Amer: 83 mL/min/{1.73_m2} (ref >59)
GFR calc non Af Amer: 72 mL/min/{1.73_m2} (ref >59)
Globulin, Total: 2.8 g/dL (ref 1.5–4.5)
Glucose: 107 mg/dL — ABNORMAL HIGH (ref 65–99)
Potassium: 4 mmol/L (ref 3.5–5.2)
Sodium: 140 mmol/L (ref 134–144)
Total Protein: 7.1 g/dL (ref 6.0–8.5)

## 2019-03-06 LAB — LIPID PANEL
Chol/HDL Ratio: 3.4 ratio (ref 0.0–5.0)
Cholesterol, Total: 163 mg/dL (ref 100–199)
HDL: 48 mg/dL (ref >39)
LDL Chol Calc (NIH): 100 mg/dL — ABNORMAL HIGH (ref 0–99)
Triglycerides: 80 mg/dL (ref 0–149)
VLDL Cholesterol Cal: 15 mg/dL (ref 5–40)

## 2019-03-06 LAB — HEMOGLOBIN A1C
Est. average glucose Bld gHb Est-mCnc: 108 mg/dL
Hgb A1c MFr Bld: 5.4 % (ref 4.8–5.6)

## 2019-03-06 LAB — TSH: TSH: 1.11 u[IU]/mL (ref 0.450–4.500)

## 2019-03-06 NOTE — Progress Notes (Signed)
Subjective:    Patient ID: Lance Long, male    DOB: 10/18/1965, 53 y.o.   MRN: [id number]  HPI:  12/19/17 OV:   Lance Long is here to establish as a new pt.  He is a pleasant 53 year old male. PMH:  HTN, Migraines with aura He has been on HCTZ 25mg  QD for >15 years He was initially dx'd with migraine at age 53 (11 years ago).  He would experience  1-3 migraines/month and then they eventually reduced to once/month. The last 4 weeks he reports HAs have increased to 2/week- will last all day and cause nausea w/o vomiting. He reports pain will "be on the top of my head", described as aching and rates 8/10. He previously used Imitrex, however has been out "for quite awhile" He has been using OTC NSAIDs with only minimal sx relief. He estimates to drink >gallon water/day and eats only one small meal/day His wife of 27 years passed away April 2019 He has one 53 yr old daughter "[name]" that is living with him He is not in therapy, declined CBT referral He reports strong support system of family/friends and denies acute depression He denies tobacco use and seldom drinks ETOH He works three jobs, averaging >95 hrs/week He exercises- cardio at least once week for 30 minutes  02/26/18 OV: Lance Long is here for CPE He has been able to reduce work hours per week to 40-45, which has allowed him to dramatically increase time for exercise- now able to exercise 5-6 times week, cardio and weigh training He has a new girlfriend in his life and is experiencing ED, most likely r/t to performance anxiety since this is his first girlfriend since the death of his wife April 2019 (thet were together for >27 years). Reviewed most recent labs- overall stable  05/29/2018 OV: Lance Long presents for 3 month f/u: HTN, migraine with aura He has increased regular exercise- M/W/F upper body weight training with 10min cardio T/R Lower body weight training with 20min cardio Has has dramatically reduced Na+ intake He  denies CP/dyspnea/dizziness/HA/palpitations He reports "feeling great" He continues to abstain from tobacco/vape/excessive ETOH use  03/07/2019 OV: Lance Long is here for CPE. He has been biking most days of the week- he has lost >16 lbs since last OV in July 2020. Current wt 204 Body mass index is 31.12 kg/m.   03/05/2019 OV: TSH-WNL, 1.110  A1c-WNL, 5.4  CBC-stable  CMP-AST, slight increase 59- he denies frequent  ETOH/acetaminophen   The 10-year ASCVD risk score Denman George(Goff DC Jr., et al., 2013) is: 6.1%   Values used to calculate the score:     Age: [age] years     Sex: Male     Is Non-Hispanic African American: Yes     Diabetic: No     Tobacco smoker: No     Systolic Blood Pressure: 103 mmHg     Is BP treated: Yes     HDL Cholesterol: 48 mg/dL     Total Cholesterol: 163 mg/dL  [id number]  He denies first degree family hx of MI/CAD/CVA Recommend repeating in 3 months  Healthcare Maintenance: Colonoscopy-UTD, last 02/09/2018-repeat 10 years  Immunizations-UTD  Patient Care Team    Relationship Specialty Notifications Start End  Chalese Peach, Jinny BlossomKaty D, NP PCP - General Family Medicine  12/19/17     Patient Active Problem List   Diagnosis Date Noted  . Elevated LDL cholesterol level 03/07/2019  . Acute pain of right knee 11/14/2018  .  Leg numbness 06/13/2018  . Leg weakness, bilateral 06/13/2018  . Erectile dysfunction 02/26/2018  . Chronic migraine 02/06/2018  . Obstructive sleep apnea 02/06/2018  . Migraine 12/19/2017  . Healthcare maintenance 12/19/2017  . OSA (obstructive sleep apnea) 10/29/2014  . Benign essential HTN 10/29/2014  . Obesity (BMI 30-39.9) 10/29/2014  . Closed fracture of metatarsal bone(s) 02/12/2013     Past Medical History:  Diagnosis Date  . Folliculitis barbae   . Hypertension   . Migraine   . OSA (obstructive sleep apnea)   . Sleep apnea    does not use c-pap machine     Past Surgical History:  Procedure Laterality Date  . FOOT SURGERY Left  11.22.13  . TMJ JAW  08/1998     Family History  Problem Relation Age of Onset  . Hypertension Mother   . Fibromyalgia Mother   . Diabetes Mother   . Liver cancer Father   . Heart attack Maternal Grandmother   . Diabetes Maternal Grandmother   . Hypertension Maternal Grandmother   . Stroke Maternal Grandmother   . Liver disease Maternal Grandfather   . Healthy Sister   . Healthy Brother   . Colon cancer Neg Hx   . Colon polyps Neg Hx   . Esophageal cancer Neg Hx   . Rectal cancer Neg Hx   . Stomach cancer Neg Hx      Social History   Substance and Sexual Activity  Drug Use No     Social History   Substance and Sexual Activity  Alcohol Use Yes  . Alcohol/week: 2.0 standard drinks  . Types: 2 Glasses of wine per week   Comment: occasional     Social History   Tobacco Use  Smoking Status Never Smoker  Smokeless Tobacco Never Used     Outpatient Encounter Medications as of 03/07/2019  Medication Sig  . hydrochlorothiazide (HYDRODIURIL) 25 MG tablet TAKE 1 TABLET BY MOUTH EVERY DAY  . HYDROcodone-acetaminophen (NORCO) 5-325 MG tablet Take 1 tablet by mouth daily as needed for moderate pain.  Marland Kitchen ibuprofen (ADVIL) 800 MG tablet Take 1 tablet (800 mg total) by mouth every 8 (eight) hours as needed.  . Multiple Vitamin (MULTI VITAMIN DAILY) TABS Take by mouth daily.  . sildenafil (VIAGRA) 50 MG tablet TAKE 1/2 TO 1 TABLET 30MINS TO 4 HOURS PRIOR TO SEXUAL ACTIVITY  . SUMAtriptan (IMITREX) 100 MG tablet Take 1 tablet (100 mg total) by mouth every 2 (two) hours as needed for migraine. May repeat in 2 hours if headache persists or recurs.  . [DISCONTINUED] HYDROcodone-acetaminophen (NORCO) 5-325 MG tablet Take 1-2 tablets by mouth daily as needed.   No facility-administered encounter medications on file as of 03/07/2019.     Allergies: Patient has no known allergies.  Body mass index is 31.12 kg/m.  Blood pressure 103/64, pulse 70, temperature 99.3 F (37.4 C),  temperature source Oral, height 5\' 8"  (1.727 m), weight 204 lb 11.2 oz (92.9 kg), SpO2 100 %.  Review of Systems  Constitutional: Positive for fatigue. Negative for activity change, appetite change, chills, diaphoresis, fever and unexpected weight change.  HENT: Negative for congestion.   Eyes: Negative for visual disturbance.  Respiratory: Negative for cough, chest tightness, shortness of breath, wheezing and stridor.   Cardiovascular: Negative for chest pain, palpitations and leg swelling.  Gastrointestinal: Negative for abdominal distention, anal bleeding, blood in stool, diarrhea, nausea and vomiting.  Endocrine: Negative for polydipsia, polyphagia and polyuria.  Genitourinary: Negative for difficulty urinating  and flank pain.  Musculoskeletal: Negative for arthralgias, back pain, gait problem, joint swelling, myalgias, neck pain and neck stiffness.  Neurological: Negative for dizziness and headaches.  Hematological: Negative for adenopathy. Does not bruise/bleed easily.  Psychiatric/Behavioral: Negative for agitation, behavioral problems, confusion, decreased concentration, dysphoric mood, hallucinations, self-injury, sleep disturbance and suicidal ideas. The patient is not nervous/anxious and is not hyperactive.        Objective:   Physical Exam Vitals signs and nursing note reviewed.  Constitutional:      General: He is not in acute distress.    Appearance: Normal appearance. He is not toxic-appearing or diaphoretic.  HENT:     Head: Normocephalic and atraumatic.     Right Ear: Tympanic membrane, ear canal and external ear normal. There is no impacted cerumen.     Left Ear: Tympanic membrane, ear canal and external ear normal. There is no impacted cerumen.     Nose: Nose normal. No congestion.     Mouth/Throat:     Mouth: Mucous membranes are moist.     Pharynx: No oropharyngeal exudate.  Eyes:     Extraocular Movements: Extraocular movements intact.     Conjunctiva/sclera:  Conjunctivae normal.     Pupils: Pupils are equal, round, and reactive to light.  Neck:     Musculoskeletal: Normal range of motion and neck supple.  Cardiovascular:     Rate and Rhythm: Normal rate and regular rhythm.     Pulses: Normal pulses.     Heart sounds: Normal heart sounds. No murmur. No gallop.   Pulmonary:     Effort: Pulmonary effort is normal. No respiratory distress.     Breath sounds: Normal breath sounds. No stridor. No wheezing, rhonchi or rales.  Chest:     Chest wall: No tenderness.  Abdominal:     General: Abdomen is flat. Bowel sounds are normal. There is no distension.     Palpations: Abdomen is soft. There is no mass.     Tenderness: There is no abdominal tenderness. There is no right CVA tenderness, left CVA tenderness, guarding or rebound.     Hernia: No hernia is present.  Genitourinary:    Comments: He declined DRE/gential examination  Musculoskeletal: Normal range of motion.        General: No tenderness.     Thoracic back: He exhibits deformity.     Comments: Thoracic kyphosis   Skin:    General: Skin is warm and dry.     Capillary Refill: Capillary refill takes less than 2 seconds.  Neurological:     Mental Status: He is alert and oriented to person, place, and time.     Coordination: Coordination normal.  Psychiatric:        Mood and Affect: Mood normal.        Behavior: Behavior normal.        Thought Content: Thought content normal.        Judgment: Judgment normal.       Assessment & Plan:   1. Need for influenza vaccination   2. Healthcare maintenance   3. Migraine with status migrainosus, not intractable, unspecified migraine type   4. Benign essential HTN   5. Elevated LDL cholesterol level     Healthcare maintenance Great job on your blood pressure and weight loss! Overall your labs are stable. Follow heart healthy diet and recommend re-checking your cholesterol panel in 3 months. Office visit follow-up 6 months. Continue to  social distance and wear a mask  when in public.  Migraine Stable, denies need for refill on sumatriptan 100mg    Benign essential HTN BP at goal 130/64, HR 70 Continue HCTZ 25mg  QD   Elevated LDL cholesterol level The ASCVD risk score DC Jr., et al., 2013) is: 6.1%   Values used to calculate the score:     Age: 3 years     Sex: Male     Is Non-Hispanic African American: Yes     Diabetic: No     Tobacco smoker: No     Systolic Blood Pressure: 103 mmHg     Is BP treated: Yes     HDL Cholesterol: 48 mg/dL     Total Cholesterol: 163 mg/dL 2014 He denies first degree family hx of MI/CAD/CVA    FOLLOW-UP:  Return in about 3 months (around 06/07/2019) for Fasting Labs.

## 2019-03-07 ENCOUNTER — Other Ambulatory Visit: Payer: Self-pay

## 2019-03-07 ENCOUNTER — Ambulatory Visit (INDEPENDENT_AMBULATORY_CARE_PROVIDER_SITE_OTHER): Payer: BC Managed Care – PPO | Admitting: Adult Health

## 2019-03-07 ENCOUNTER — Encounter: Payer: Self-pay | Admitting: Adult Health

## 2019-03-07 VITALS — BP 103/64 | HR 70 | Temp 99.3°F | Ht 68.0 in | Wt 204.7 lb

## 2019-03-07 DIAGNOSIS — Z0000 Encounter for general adult medical examination without abnormal findings: Secondary | ICD-10-CM | POA: Diagnosis not present

## 2019-03-07 DIAGNOSIS — Z23 Encounter for immunization: Secondary | ICD-10-CM

## 2019-03-07 DIAGNOSIS — I10 Essential (primary) hypertension: Secondary | ICD-10-CM | POA: Diagnosis not present

## 2019-03-07 DIAGNOSIS — G43901 Migraine, unspecified, not intractable, with status migrainosus: Secondary | ICD-10-CM

## 2019-03-07 DIAGNOSIS — E7800 Pure hypercholesterolemia, unspecified: Secondary | ICD-10-CM | POA: Insufficient documentation

## 2019-03-07 NOTE — Assessment & Plan Note (Signed)
BP at goal 130/64, HR 70 Continue HCTZ 25mg  QD

## 2019-03-07 NOTE — Patient Instructions (Addendum)

## 2019-03-07 NOTE — Assessment & Plan Note (Signed)
The 10-year ASCVD risk score Mikey Bussing DC Brooke Bonito., et al., 2013) is: 6.1%   Values used to calculate the score:     Age: 53 years     Sex: Male     Is Non-Hispanic African American: Yes     Diabetic: No     Tobacco smoker: No     Systolic Blood Pressure: 346 mmHg     Is BP treated: Yes     HDL Cholesterol: 48 mg/dL     Total Cholesterol: 163 mg/dL LDL-100 He denies first degree family hx of MI/CAD/CVA

## 2019-03-07 NOTE — Assessment & Plan Note (Signed)
Great job on your blood pressure and weight loss! Overall your labs are stable. Follow heart healthy diet and recommend re-checking your cholesterol panel in 3 months. Office visit follow-up 6 months. Continue to social distance and wear a mask when in public.

## 2019-03-07 NOTE — Assessment & Plan Note (Signed)
Stable, denies need for refill on sumatriptan 100mg 

## 2019-05-08 DIAGNOSIS — Z20828 Contact with and (suspected) exposure to other viral communicable diseases: Secondary | ICD-10-CM | POA: Diagnosis not present

## 2019-05-23 ENCOUNTER — Other Ambulatory Visit: Payer: Self-pay | Admitting: Adult Health

## 2019-06-06 ENCOUNTER — Other Ambulatory Visit: Payer: Self-pay

## 2019-06-06 DIAGNOSIS — E7800 Pure hypercholesterolemia, unspecified: Secondary | ICD-10-CM

## 2019-06-07 ENCOUNTER — Other Ambulatory Visit: Payer: Self-pay

## 2019-06-07 ENCOUNTER — Other Ambulatory Visit: Payer: BC Managed Care – PPO

## 2019-06-07 DIAGNOSIS — E7800 Pure hypercholesterolemia, unspecified: Secondary | ICD-10-CM

## 2019-06-08 LAB — LIPID PANEL
Chol/HDL Ratio: 3.7 ratio (ref 0.0–5.0)
Cholesterol, Total: 165 mg/dL (ref 100–199)
HDL: 45 mg/dL (ref >39)
LDL Chol Calc (NIH): 97 mg/dL (ref 0–99)
Triglycerides: 128 mg/dL (ref 0–149)
VLDL Cholesterol Cal: 23 mg/dL (ref 5–40)

## 2019-07-04 NOTE — Progress Notes (Signed)
Received Epic notification that pt has not read MyChart message regarding results.     Recent lab results  From  Stan Head, CMA To  CORRAN LALONE Sent and Delivered  06/10/2019 3:26 PM  Mr. Abdon, [place] asked that I inform you that your total cholestol is 165, triglycerides are 128, HDL (good cholesterol) is 45 (normal is greater than 40) and your LDL cholesterol (bad cholesterol) is 97.  This puts your 10-year risk score for a sudden cardiac event such as a heart attack or stroke at 6.6%  [name] recommends at least twice weekly statin therapy. Please let us know if you are agreeable to taking this medication so that Orpha Bur can send in a prescription for you. If so, you will need to hkave you cholesterol panel and liver functions recheck in 6 weeks.   Wishing you well,  Archie Patten, CMA for  William Hamburger, NP   [address]  MyChart User Last Read On  Ma Hillock Not Read      Letter mailed to pt.  Tiajuana Amass, CMA

## 2019-08-21 ENCOUNTER — Telehealth: Payer: Self-pay | Admitting: Adult Health

## 2019-08-21 DIAGNOSIS — E7800 Pure hypercholesterolemia, unspecified: Secondary | ICD-10-CM

## 2019-08-21 MED ORDER — ATORVASTATIN CALCIUM 20 MG PO TABS: ORAL_TABLET | ORAL | 0 refills | Status: DC

## 2019-08-21 NOTE — Telephone Encounter (Signed)
Please send RX to pharmacy.  Tiajuana Amass, CMA

## 2019-08-21 NOTE — Telephone Encounter (Signed)
RX sent for Atorvastatin 20 mg, take 1 tablet by mouth twice a week. Patient is to return in 6 weeks for labs- lipid panel and hepatic function tests.  Thank you! Peace Noyes

## 2019-08-21 NOTE — Telephone Encounter (Signed)
Patient called agreed to start recommended medication.   -----see message below :   Dear Lance Long,  Our office previously sent you a MyChart message regarding your most recent lab results.  However, our system indicates that you have not read that message.  [name] asked that I inform you that your total cholestol is 165, triglycerides are 128, HDL (good cholesterol) is 45 (normal is greater than 40) and your LDL cholesterol (bad cholesterol) is 97.  This puts your 10-year risk score for a sudden cardiac event such as a heart attack or stroke at 6.6%   Therefore, [name] recommends at least twice weekly statin therapy. Please let us know if you are agreeable to taking this medication so that we can send in a prescription for you. If so, you will need to have you cholesterol panel and liver functions recheck in 6 weeks.     ---- Pt uses :  Preferred Pharmacies      [facility] [phone] [place], [place] - [address] [phone] (Phone) [phone] (Fax   --glh

## 2019-08-21 NOTE — Telephone Encounter (Signed)
LVM for pt to call to discuss.  T. Medardo Hassing, CMA  

## 2019-08-22 ENCOUNTER — Telehealth: Payer: Self-pay

## 2019-08-22 NOTE — Telephone Encounter (Signed)
Pt returned call and was advised of labs needed in 6 weeks.  Pt also c/o bilateral leg and foot pain.  Advised pt that he would need OV to evaluate.  Pt expressed understanding and is agreeable.  Pt transferred to front desk for scheduling.  Tiajuana Amass, CMA

## 2019-08-22 NOTE — Telephone Encounter (Signed)
08/22/2019  LVM for pt to return call.  Also sent MyChart message to pt with instructions.  Tiajuana Amass, CMA

## 2019-08-22 NOTE — Telephone Encounter (Signed)
08/22/2019  See additional telephone message from today for documentation.  Tiajuana Amass, CMA

## 2019-08-28 ENCOUNTER — Other Ambulatory Visit: Payer: Self-pay | Admitting: Adult Health

## 2019-09-11 ENCOUNTER — Other Ambulatory Visit: Payer: Self-pay | Admitting: Physician Assistant

## 2019-09-18 NOTE — Progress Notes (Deleted)
Established Patient Office Visit  Subjective:  Patient ID: SEM Long, male    DOB: 03-24-1966  Age: 54 y.o. MRN: [id number]  CC: No chief complaint on file.   HPI Lance Long presents for ***  Past Medical History:  Diagnosis Date  . Folliculitis barbae   . Hypertension   . Migraine   . OSA (obstructive sleep apnea)   . Sleep apnea    does not use c-pap machine    Past Surgical History:  Procedure Laterality Date  . FOOT SURGERY Left 11.22.13  . TMJ JAW  08/1998    Family History  Problem Relation Age of Onset  . Hypertension Mother   . Fibromyalgia Mother   . Diabetes Mother   . Liver cancer Father   . Heart attack Maternal Grandmother   . Diabetes Maternal Grandmother   . Hypertension Maternal Grandmother   . Stroke Maternal Grandmother   . Liver disease Maternal Grandfather   . Healthy Sister   . Healthy Brother   . Colon cancer Neg Hx   . Colon polyps Neg Hx   . Esophageal cancer Neg Hx   . Rectal cancer Neg Hx   . Stomach cancer Neg Hx     Social History   Socioeconomic History  . Marital status: Married    Spouse name: Not on file  . Number of children: Not on file  . Years of education: Not on file  . Highest education level: Not on file  Occupational History  . Not on file  Tobacco Use  . Smoking status: Never Smoker  . Smokeless tobacco: Never Used  Substance and Sexual Activity  . Alcohol use: Yes    Alcohol/week: 2.0 standard drinks    Types: 2 Glasses of wine per week    Comment: occasional  . Drug use: No  . Sexual activity: Not Currently  Other Topics Concern  . Not on file  Social History Narrative  . Not on file   Social Determinants of Health   Financial Resource Strain:   . Difficulty of Paying Living Expenses:   Food Insecurity:   . Worried About Charity fundraiser in the Last Year:   . Arboriculturist in the Last Year:   Transportation Needs:   . Film/video editor (Medical):   Marland Kitchen Lack of Transportation  (Non-Medical):   Physical Activity:   . Days of Exercise per Week:   . Minutes of Exercise per Session:   Stress:   . Feeling of Stress :   Social Connections:   . Frequency of Communication with Friends and Family:   . Frequency of Social Gatherings with Friends and Family:   . Attends Religious Services:   . Active Member of Clubs or Organizations:   . Attends Archivist Meetings:   Marland Kitchen Marital Status:   Intimate Partner Violence:   . Fear of Current or Ex-Partner:   . Emotionally Abused:   Marland Kitchen Physically Abused:   . Sexually Abused:     Outpatient Medications Prior to Visit  Medication Sig Dispense Refill  . atorvastatin (LIPITOR) 20 MG tablet Take 1 tablet by mouth twice a week. 90 tablet 0  . hydrochlorothiazide (HYDRODIURIL) 25 MG tablet Take 1 tablet (25 mg total) by mouth daily. **PATIENT NEEDS APT FOR ANY FURTHER REFILLS** 15 tablet 0  . HYDROcodone-acetaminophen (NORCO) 5-325 MG tablet Take 1 tablet by mouth daily as needed for moderate pain. 90 tablet 0  . ibuprofen (  ADVIL) 800 MG tablet Take 1 tablet (800 mg total) by mouth every 8 (eight) hours as needed. 60 tablet 2  . Multiple Vitamin (MULTI VITAMIN DAILY) TABS Take by mouth daily.    . sildenafil (VIAGRA) 50 MG tablet TAKE 1/2 TO 1 TABLET TO 4 HOURS PRIOR TO SEXUAL ACTIVITY 8 tablet 1  . SUMAtriptan (IMITREX) 100 MG tablet Take 1 tablet (100 mg total) by mouth every 2 (two) hours as needed for migraine. May repeat in 2 hours if headache persists or recurs. 12 tablet 11   No facility-administered medications prior to visit.    No Known Allergies  ROS Review of Systems    Objective:    Physical Exam  There were no vitals taken for this visit. Wt Readings from Last 3 Encounters:  03/07/19 204 lb 11.2 oz (92.9 kg)  11/14/18 220 lb 8 oz (100 kg)  06/13/18 205 lb 6.4 oz (93.2 kg)     Health Maintenance Due  Topic Date Due  . COVID-19 Vaccine (1) Never done    There are no preventive care  reminders to display for this patient.  Lab Results  Component Value Date   TSH 1.110 03/05/2019   Lab Results  Component Value Date   WBC 6.8 03/05/2019   HGB 15.8 03/05/2019   HCT 45.5 03/05/2019   MCV 86 03/05/2019   PLT 278 03/05/2019   Lab Results  Component Value Date   NA 140 03/05/2019   K 4.0 03/05/2019   CO2 29 03/05/2019   GLUCOSE 107 (H) 03/05/2019   BUN 10 03/05/2019   CREATININE 1.16 03/05/2019   BILITOT 0.4 03/05/2019   ALKPHOS 77 03/05/2019   AST 59 (H) 03/05/2019   ALT 39 03/05/2019   PROT 7.1 03/05/2019   ALBUMIN 4.3 03/05/2019   CALCIUM 9.7 03/05/2019   Lab Results  Component Value Date   CHOL 165 06/07/2019   Lab Results  Component Value Date   HDL 45 06/07/2019   Lab Results  Component Value Date   LDLCALC 97 06/07/2019   Lab Results  Component Value Date   TRIG 128 06/07/2019   Lab Results  Component Value Date   CHOLHDL 3.7 06/07/2019   Lab Results  Component Value Date   HGBA1C 5.4 03/05/2019      Assessment & Plan:   Problem List Items Addressed This Visit    None      No orders of the defined types were placed in this encounter.   Follow-up: No follow-ups on file.    Mayer Masker, PA-C

## 2019-09-26 ENCOUNTER — Ambulatory Visit: Payer: BC Managed Care – PPO | Admitting: Physician Assistant

## 2019-09-26 ENCOUNTER — Other Ambulatory Visit: Payer: BC Managed Care – PPO

## 2020-03-09 IMAGING — MR MRI OF THE RIGHT KNEE WITHOUT CONTRAST
7 series · 39 of 40 positions shown · non-contrast
Comparison: Outside radiograph November 14, 2018

CLINICAL DATA: Knee pain, persisting greater than 6 weeks, injury
while running 4-weeks ago he fell in a hole

EXAM:
MRI OF THE RIGHT KNEE WITHOUT CONTRAST
TECHNIQUE: Multiplanar, multisequence MR imaging of the knee was performed. No
intravenous contrast was administered.

[Series 6: T2 fat-sat · axial · right · 4.0mm · 0.50mm/px · z∈[-29,+112]mm · 7 of 33 slices shown (1 of 4)]
[im 1/33]
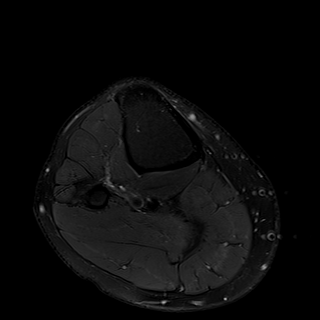
[im 6/33]
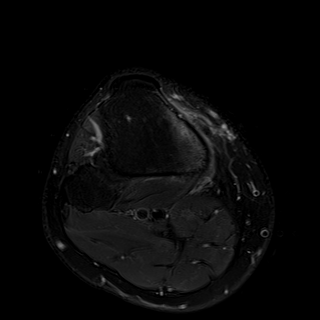
[im 11/33]
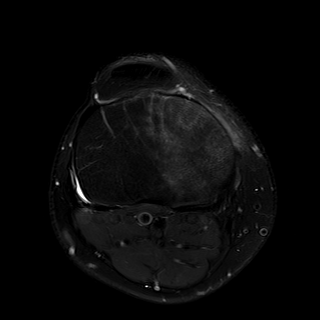
[im 17/33]
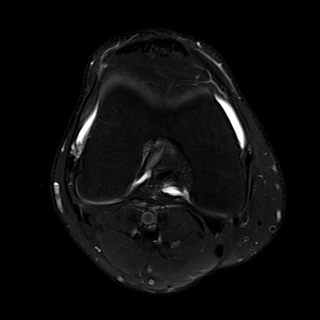
[im 22/33]
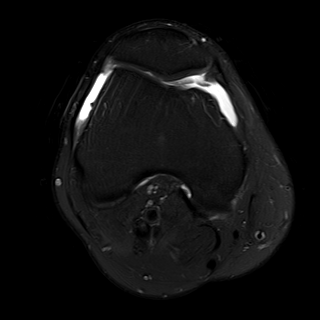
[im 27/33]
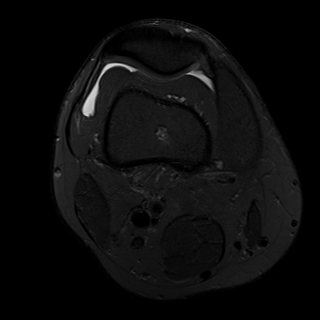
[im 33/33]
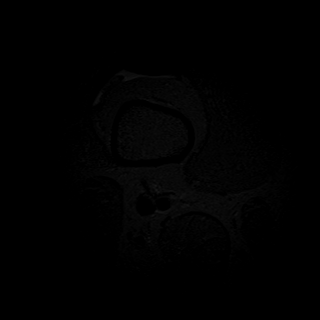

[Series 7: T2 fat-sat · coronal · right · 4.0mm · 0.39mm/px · 6 of 28 slices shown (2 of 4)]
[im 1/28]
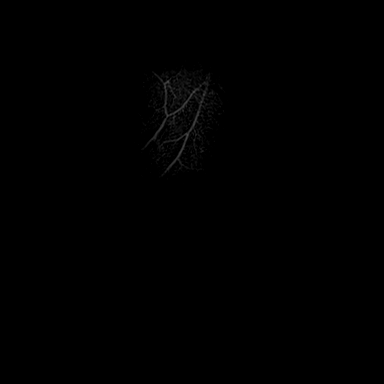
[im 6/28]
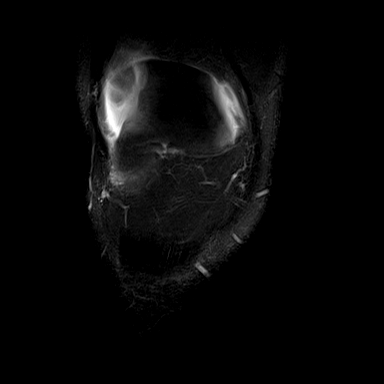
[im 11/28]
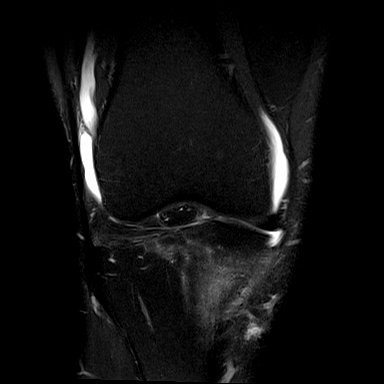
[im 17/28]
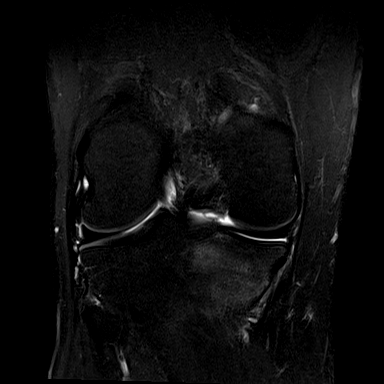
[im 22/28]
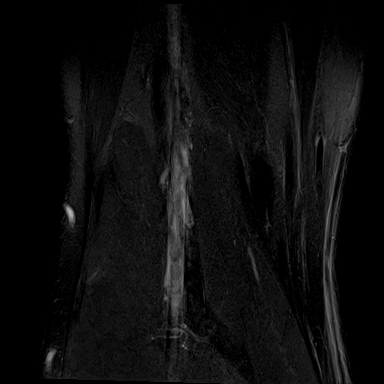
[im 28/28]
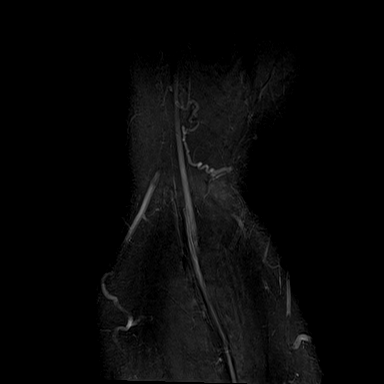

[Series 8: T1 · coronal · right · 4.0mm · 0.47mm/px · 4 of 28 slices shown]
[im 1/28]
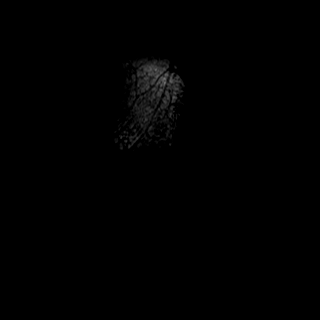
[im 7/28]
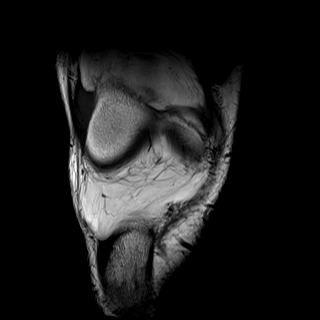
[im 14/28]
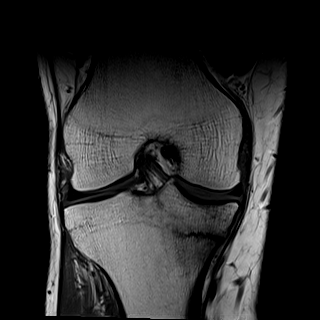
[im 21/28]
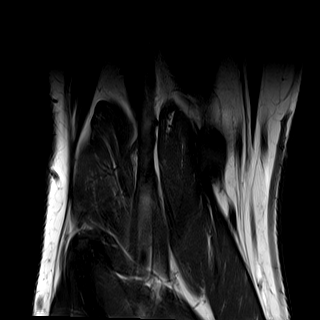

[Series 9: PD fat-sat · coronal · right · 3.0mm · 0.47mm/px · 6 of 32 slices shown (1 of 2)]
[im 1/32]
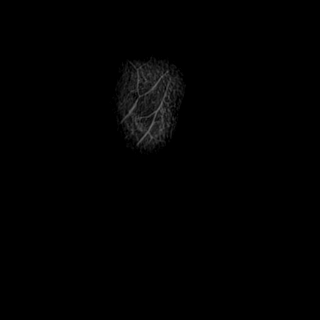
[im 7/32]
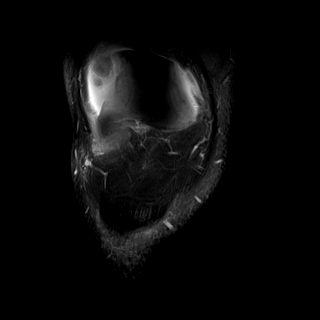
[im 13/32]
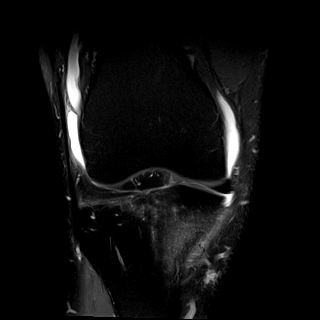
[im 19/32]
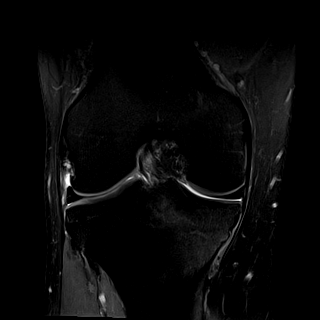
[im 25/32]
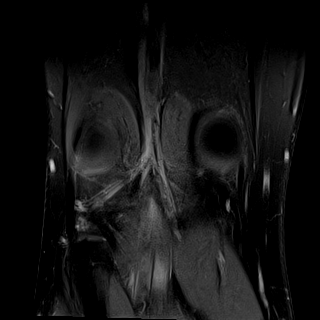
[im 32/32]
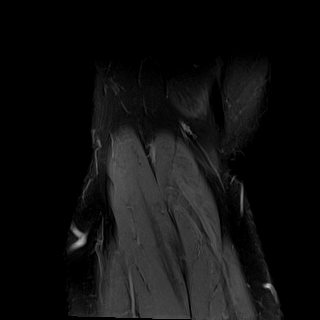

[Series 10: T2 fat-sat · axial · right · 4.0mm · 0.50mm/px · z∈[-29,+112]mm · 6 of 33 slices shown (3 of 4)]
[im 1/33]
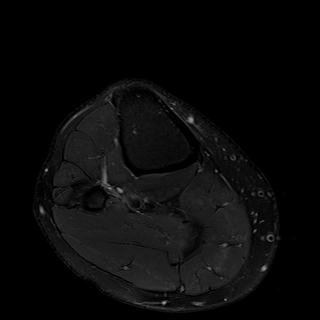
[im 7/33]
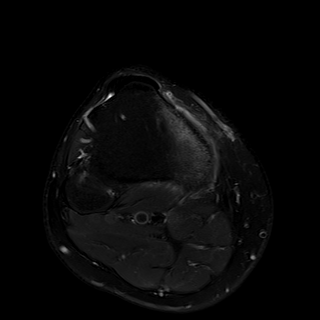
[im 13/33]
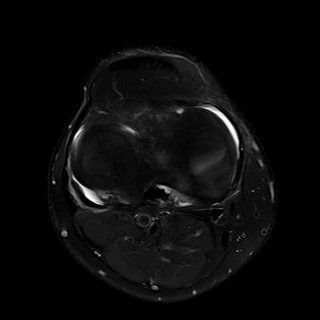
[im 20/33]
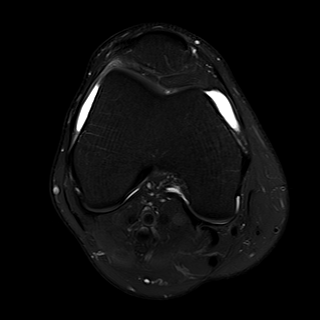
[im 26/33]
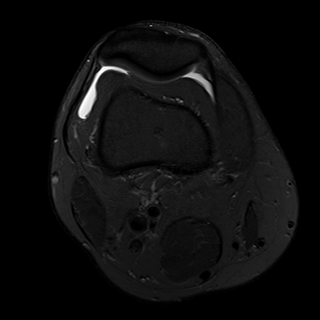
[im 33/33]
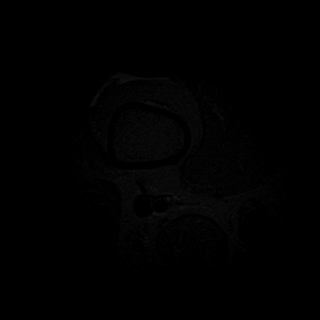

[Series 11: PD fat-sat · sagittal · right · 3.0mm · 0.39mm/px · 5 of 28 slices shown (2 of 2)]
[im 1/28]
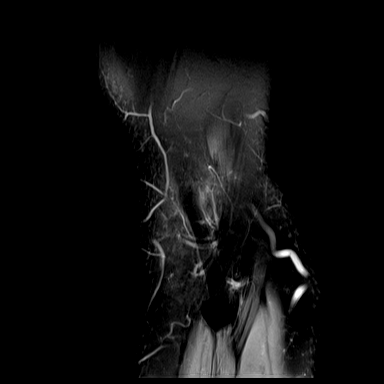
[im 7/28]
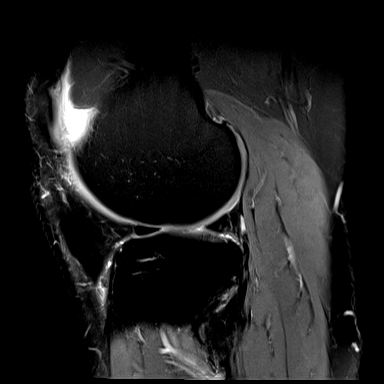
[im 14/28]
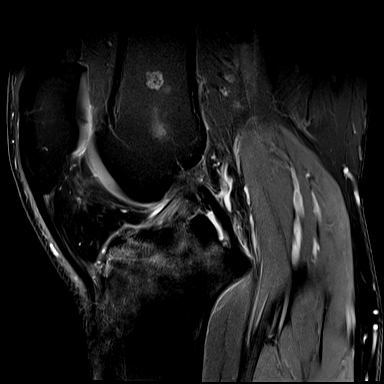
[im 21/28]
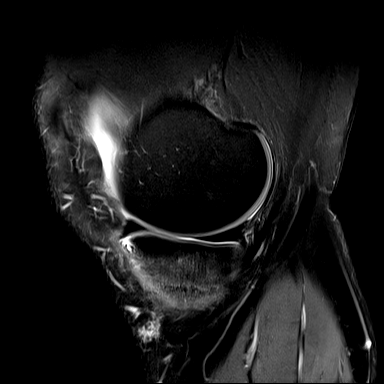
[im 28/28]
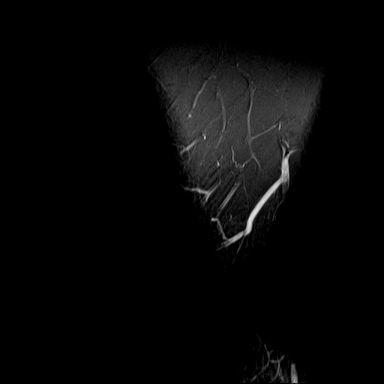

[Series 12: T2 fat-sat · sagittal · right · 3.0mm · 0.39mm/px · 5 of 28 slices shown (4 of 4)]
[im 1/28]
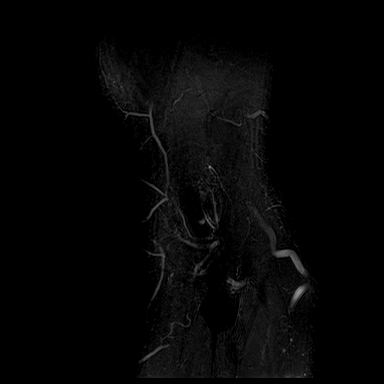
[im 7/28]
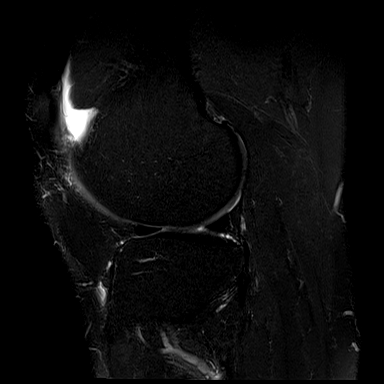
[im 14/28]
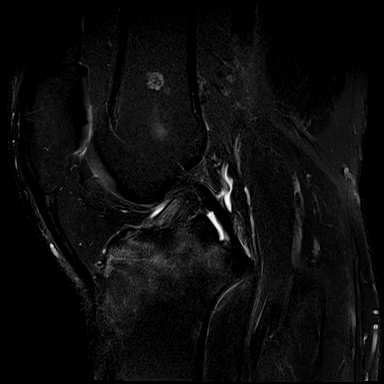
[im 21/28]
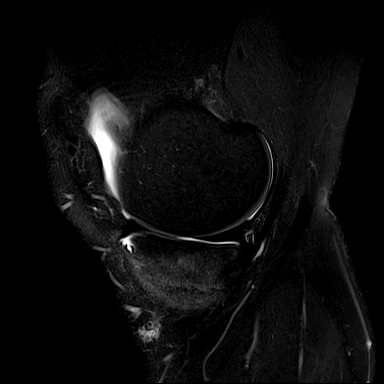
[im 28/28]
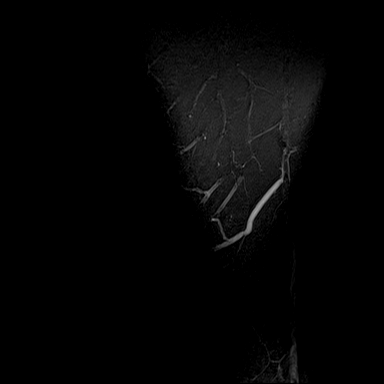

[39 of 40 positions shown; findings below may reference images not displayed]

FINDINGS: MENISCI

Medial: Intact.

Lateral: There is a nondisplaced horizontal tear seen of the
posterior horn of the lateral meniscus.

LIGAMENTS

Cruciates: The ACL is intact. The PCL is intact.

Collaterals: The MCL is intact. There is however mildly increased
signal seen around the medial collateral ligament. The lateral
collateral ligamentous complex is intact.

CARTILAGE

Patellofemoral: There is mild chondral fissuring seen in the central
patellar apex.

Medial compartment: Normal.

Lateral compartment: There is mild chondral thinning seen
weight-bearing surface of the lateral femoral condyle.

BONES: There is a nondisplaced fracture seen at the proximal medial
tibial metadiaphysis. Significant surrounding marrow edema is seen.
No intra-articular extension. No avascular necrosis. There is a 7 mm
T2 bright lesion seen within the distal femoral metadiaphysis
centrally with internal stippled calcifications, likely enchondroma.

JOINT: There is a moderate knee joint effusion. Normal Petele
Rimsberg. No plical thickening.

EXTENSOR MECHANISM: The patellar and quadriceps tendon are intact.
The retinaculum is unremarkable.

POPLITEAL FOSSA: No popliteal cyst.

OTHER:  The visualized muscles are normal in appearance.
IMPRESSION: 1. Nondisplaced proximal medial tibial fracture. No intra-articular
extension. Significant surrounding marrow edema.
2. Nondisplaced horizontal tear of the posterior lateral meniscus.
3. Intact cruciate ligaments
4. Mild lateral and patellofemoral compartment chondral disease
5. These results will be called to the ordering clinician or
representative by the Radiologist Assistant, and communication
documented in the PACS or zVision Dashboard. Grade 1 medial
collateral ligamentous sprain.

## 2020-04-07 ENCOUNTER — Ambulatory Visit (INDEPENDENT_AMBULATORY_CARE_PROVIDER_SITE_OTHER): Payer: 59 | Admitting: Physician Assistant

## 2020-04-07 ENCOUNTER — Encounter: Payer: Self-pay | Admitting: Physician Assistant

## 2020-04-07 ENCOUNTER — Other Ambulatory Visit: Payer: Self-pay

## 2020-04-07 VITALS — BP 136/83 | HR 63 | Ht 68.0 in | Wt 210.2 lb

## 2020-04-07 DIAGNOSIS — Z0000 Encounter for general adult medical examination without abnormal findings: Secondary | ICD-10-CM

## 2020-04-07 DIAGNOSIS — J309 Allergic rhinitis, unspecified: Secondary | ICD-10-CM | POA: Insufficient documentation

## 2020-04-07 DIAGNOSIS — Z23 Encounter for immunization: Secondary | ICD-10-CM

## 2020-04-07 DIAGNOSIS — G4733 Obstructive sleep apnea (adult) (pediatric): Secondary | ICD-10-CM

## 2020-04-07 DIAGNOSIS — M792 Neuralgia and neuritis, unspecified: Secondary | ICD-10-CM | POA: Insufficient documentation

## 2020-04-07 DIAGNOSIS — E7800 Pure hypercholesterolemia, unspecified: Secondary | ICD-10-CM | POA: Diagnosis not present

## 2020-04-07 DIAGNOSIS — I10 Essential (primary) hypertension: Secondary | ICD-10-CM

## 2020-04-07 DIAGNOSIS — Z1283 Encounter for screening for malignant neoplasm of skin: Secondary | ICD-10-CM

## 2020-04-07 MED ORDER — ATORVASTATIN CALCIUM 20 MG PO TABS: ORAL_TABLET | ORAL | 1 refills | Status: DC

## 2020-04-07 MED ORDER — HYDROCHLOROTHIAZIDE 25 MG PO TABS
25.0000 mg | ORAL_TABLET | Freq: Every day | ORAL | 1 refills | Status: DC
Start: 2020-04-07 — End: 2020-10-12

## 2020-04-07 NOTE — Patient Instructions (Signed)
Preventive Care 41-54 Years Old, Male Preventive care refers to lifestyle choices and visits with your health care provider that can promote health and wellness. This includes:  A yearly physical exam. This is also called an annual well check.  Regular dental and eye exams.  Immunizations.  Screening for certain conditions.  Healthy lifestyle choices, such as eating a healthy diet, getting regular exercise, not using drugs or products that contain nicotine and tobacco, and limiting alcohol use. What can I expect for my preventive care visit? Physical exam Your health care provider will check:  Height and weight. These may be used to calculate body mass index (BMI), which is a measurement that tells if you are at a healthy weight.  Heart rate and blood pressure.  Your skin for abnormal spots. Counseling Your health care provider may ask you questions about:  Alcohol, tobacco, and drug use.  Emotional well-being.  Home and relationship well-being.  Sexual activity.  Eating habits.  Work and work Statistician. What immunizations do I need?  Influenza (flu) vaccine  This is recommended every year. Tetanus, diphtheria, and pertussis (Tdap) vaccine  You may need a Td booster every 10 years. Varicella (chickenpox) vaccine  You may need this vaccine if you have not already been vaccinated. Zoster (shingles) vaccine  You may need this after age 64. Measles, mumps, and rubella (MMR) vaccine  You may need at least one dose of MMR if you were born in 1957 or later. You may also need a second dose. Pneumococcal conjugate (PCV13) vaccine  You may need this if you have certain conditions and were not previously vaccinated. Pneumococcal polysaccharide (PPSV23) vaccine  You may need one or two doses if you smoke cigarettes or if you have certain conditions. Meningococcal conjugate (MenACWY) vaccine  You may need this if you have certain conditions. Hepatitis A  vaccine  You may need this if you have certain conditions or if you travel or work in places where you may be exposed to hepatitis A. Hepatitis B vaccine  You may need this if you have certain conditions or if you travel or work in places where you may be exposed to hepatitis B. Haemophilus influenzae type b (Hib) vaccine  You may need this if you have certain risk factors. Human papillomavirus (HPV) vaccine  If recommended by your health care provider, you may need three doses over 6 months. You may receive vaccines as individual doses or as more than one vaccine together in one shot (combination vaccines). Talk with your health care provider about the risks and benefits of combination vaccines. What tests do I need? Blood tests  Lipid and cholesterol levels. These may be checked every 5 years, or more frequently if you are over 60 years old.  Hepatitis C test.  Hepatitis B test. Screening  Lung cancer screening. You may have this screening every year starting at age 43 if you have a 30-pack-year history of smoking and currently smoke or have quit within the past 15 years.  Prostate cancer screening. Recommendations will vary depending on your family history and other risks.  Colorectal cancer screening. All adults should have this screening starting at age 72 and continuing until age 2. Your health care provider may recommend screening at age 14 if you are at increased risk. You will have tests every 1-10 years, depending on your results and the type of screening test.  Diabetes screening. This is done by checking your blood sugar (glucose) after you have not eaten  for a while (fasting). You may have this done every 1-3 years.  Sexually transmitted disease (STD) testing. Follow these instructions at home: Eating and drinking  Eat a diet that includes fresh fruits and vegetables, whole grains, lean protein, and low-fat dairy products.  Take vitamin and mineral supplements as  recommended by your health care provider.  Do not drink alcohol if your health care provider tells you not to drink.  If you drink alcohol: ? Limit how much you have to 0-2 drinks a day. ? Be aware of how much alcohol is in your drink. In the [place] one drink equals one 12 oz bottle of beer (355 mL), one 5 oz glass of wine (148 mL), or one 1 oz glass of hard liquor (44 mL). Lifestyle  Take daily care of your teeth and gums.  Stay active. Exercise for at least 30 minutes on 5 or more days each week.  Do not use any products that contain nicotine or tobacco, such as cigarettes, e-cigarettes, and chewing tobacco. If you need help quitting, ask your health care provider.  If you are sexually active, practice safe sex. Use a condom or other form of protection to prevent STIs (sexually transmitted infections).  Talk with your health care provider about taking a low-dose aspirin every day starting at age 53. What's next?  Go to your health care provider once a year for a well check visit.  Ask your health care provider how often you should have your eyes and teeth checked.  Stay up to date on all vaccines. This information is not intended to replace advice given to you by your health care provider. Make sure you discuss any questions you have with your health care provider. Document Revised: 04/12/2018 Document Reviewed: 04/12/2018 Elsevier Patient Education  2020 [employer].

## 2020-04-07 NOTE — Progress Notes (Addendum)
Male physical   Impression and Recommendations:    1. Healthcare maintenance   2. Elevated LDL cholesterol level   3. Benign essential HTN   4. Need for influenza vaccination   5. Obstructive sleep apnea   6. Skin cancer screening      1) Anticipatory Guidance: Discussed skin CA prevention and sunscreen when outside along with skin surveillance; eating a balanced and modest diet; physical activity at least 25 minutes per day or minimum of 150 min/ week moderate to intense activity.  2) Immunizations / Screenings / Labs:   All immunizations are up-to-date per recommendations or will be updated today if pt allows.    - Patient understands with dental and vision screens they will schedule independently.  - Will obtain CBC, CMP, HgA1c, Lipid panel, PSA and TSH when fasting. - UTD on colonoscopy, Tdap, Hep C and HIV screenings. - Agreeable to influenza vaccine.  3) Weight:  Recommend to improve diet habits to improve overall feelings of well being and objective health data. Improve nutrient density of diet through increasing intake of fruits and vegetables and decreasing saturated fats, white flour products and refined sugars.   4) Healthcare Maintenance: -Continue current medication regimen. Provided refills. -Follow a heart healthy diet. Continue to stay active. -Placed new referral to sleep center.  -Placed dermatology referral. -Follow up in 6 months for HTN, HLD.   Orders Placed This Encounter  Procedures  . Flu Vaccine QUAD 6+ mos PF IM (Fluarix Quad PF)  . CBC    Standing Status:   Future    Number of Occurrences:   1    Standing Expiration Date:   05/08/2020  . Comprehensive metabolic panel    Standing Status:   Future    Number of Occurrences:   1    Standing Expiration Date:   05/08/2020    Order Specific Question:   Has the patient fasted?    Answer:   Yes  . Hemoglobin A1c    Standing Status:   Future    Number of Occurrences:   1    Standing Expiration  Date:   05/08/2020  . TSH    Standing Status:   Future    Number of Occurrences:   1    Standing Expiration Date:   05/08/2020  . Lipid panel    Standing Status:   Future    Number of Occurrences:   1    Standing Expiration Date:   05/08/2020    Order Specific Question:   Has the patient fasted?    Answer:   Yes  . PSA    Standing Status:   Future    Number of Occurrences:   1    Standing Expiration Date:   05/08/2020  . Ambulatory referral to Sleep Studies    Referral Priority:   Routine    Referral Type:   Consultation    Referral Reason:   Specialty Services Required    Number of Visits Requested:   1  . Ambulatory referral to Dermatology    Referral Priority:   Routine    Referral Type:   Consultation    Referral Reason:   Specialty Services Required    Requested Specialty:   Dermatology    Number of Visits Requested:   1    Meds ordered this encounter  Medications  . atorvastatin (LIPITOR) 20 MG tablet    Sig: Take 1 tablet by mouth twice a week.  Dispense:  90 tablet    Refill:  1  . hydrochlorothiazide (HYDRODIURIL) 25 MG tablet    Sig: Take 1 tablet (25 mg total) by mouth daily.    Dispense:  90 tablet    Refill:  1     Return in about 6 months (around 10/06/2020) for HTN, HLD.    Gross side effects, risk and benefits, and alternatives of medications discussed with patient.  Patient is aware that all medications have potential side effects and we are unable to predict every side effect or drug-drug interaction that may occur.  Expresses verbal understanding and consents to current therapy plan and treatment regimen.  Please see AVS handed out to patient at the end of our visit for further patient instructions/ counseling done pertaining to today's office visit.  Note:  This note was prepared with assistance of Dragon voice recognition software. Occasional wrong-word or sound-a-like substitutions may have occurred due to the inherent limitations of voice recognition  software.      Subjective:        CC: CPE   HPI: Lance Long is a 54 y.o. male who [facility] health maintenance exam.     Health Maintenance Summary  - Reviewed and updated, unless pt declines services.  Last Cologuard or Colonoscopy:  02/09/2018- repeat in 10 yrs Tobacco History Reviewed:  Y, never a smoker Alcohol / drug use:    No concerns, no excessive use / no use Exercise Habits:  Works out 4-5 times/wk, recently less Dental Home: Y  Eye exams:Y Dermatology home: Placed referral.  Male history: STD concerns:   none, monogamous Additional penile/ urinary concerns: none      Immunization History  Administered Date(s) Administered  . Influenza,inj,Quad PF,6+ Mos 02/26/2018, 03/07/2019, 04/07/2020  . PFIZER SARS-COV-2 Vaccination 12/08/2019, 12/29/2019  . Tdap 08/15/2007, 02/26/2018     Health Maintenance  Topic Date Due  . COLONOSCOPY  02/10/2028  . TETANUS/TDAP  02/27/2028  . INFLUENZA VACCINE  Completed  . COVID-19 Vaccine  Completed  . Hepatitis C Screening  Completed  . HIV Screening  Completed       Wt Readings from Last 3 Encounters:  04/07/20 210 lb 3.2 oz (95.3 kg)  03/07/19 204 lb 11.2 oz (92.9 kg)  11/14/18 220 lb 8 oz (100 kg)   BP Readings from Last 3 Encounters:  04/07/20 136/83  03/07/19 103/64  11/14/18 138/72   Pulse Readings from Last 3 Encounters:  04/07/20 63  03/07/19 70  11/14/18 68    Patient Active Problem List   Diagnosis Date Noted  . Peripheral neuralgia 04/07/2020  . Allergic rhinitis 04/07/2020  . Elevated LDL cholesterol level 03/07/2019  . Acute pain of right knee 11/14/2018  . Leg numbness 06/13/2018  . Leg weakness, bilateral 06/13/2018  . Erectile dysfunction 02/26/2018  . Chronic migraine 02/06/2018  . Obstructive sleep apnea 02/06/2018  . Migraine 12/19/2017  . Healthcare maintenance 12/19/2017  . OSA (obstructive sleep apnea)  10/29/2014  . Benign essential HTN 10/29/2014  . Obesity (BMI 30-39.9) 10/29/2014  . Closed fracture of metatarsal bone(s) 02/12/2013    Past Medical History:  Diagnosis Date  . Folliculitis barbae   . Hypertension   . Migraine   . OSA (obstructive sleep apnea)   . Sleep apnea    does not use c-pap machine    Past Surgical History:  Procedure Laterality Date  . FOOT SURGERY Left 11.22.13  .  TMJ JAW  08/1998    Family History  Problem Relation Age of Onset  . Hypertension Mother   . Fibromyalgia Mother   . Diabetes Mother   . Liver cancer Father   . Heart attack Maternal Grandmother   . Diabetes Maternal Grandmother   . Hypertension Maternal Grandmother   . Stroke Maternal Grandmother   . Liver disease Maternal Grandfather   . Healthy Sister   . Healthy Brother   . Colon cancer Neg Hx   . Colon polyps Neg Hx   . Esophageal cancer Neg Hx   . Rectal cancer Neg Hx   . Stomach cancer Neg Hx     Social History   Substance and Sexual Activity  Drug Use No  ,  Social History   Substance and Sexual Activity  Alcohol Use Yes  . Alcohol/week: 2.0 standard drinks  . Types: 2 Glasses of wine per week   Comment: occasional  ,  Social History   Tobacco Use  Smoking Status Never Smoker  Smokeless Tobacco Never Used  ,  Social History   Substance and Sexual Activity  Sexual Activity Not Currently    Patient's Medications  New Prescriptions   No medications on file  Previous Medications   IBUPROFEN (ADVIL) 800 MG TABLET    Take 1 tablet (800 mg total) by mouth every 8 (eight) hours as needed.   MULTIPLE VITAMIN (MULTI VITAMIN DAILY) TABS    Take by mouth daily.   SILDENAFIL (VIAGRA) 50 MG TABLET    TAKE 1/2 TO 1 TABLET TO 4 HOURS PRIOR TO SEXUAL ACTIVITY  Modified Medications   Modified Medication Previous Medication   ATORVASTATIN (LIPITOR) 20 MG TABLET atorvastatin (LIPITOR) 20 MG tablet      Take 1 tablet by mouth twice a week.    Take 1 tablet by  mouth twice a week.   HYDROCHLOROTHIAZIDE (HYDRODIURIL) 25 MG TABLET hydrochlorothiazide (HYDRODIURIL) 25 MG tablet      Take 1 tablet (25 mg total) by mouth daily.    Take 1 tablet (25 mg total) by mouth daily. **PATIENT NEEDS APT FOR ANY FURTHER REFILLS**  Discontinued Medications   HYDROCODONE-ACETAMINOPHEN (NORCO) 5-325 MG TABLET    Take 1 tablet by mouth daily as needed for moderate pain.   SUMATRIPTAN (IMITREX) 100 MG TABLET    Take 1 tablet (100 mg total) by mouth every 2 (two) hours as needed for migraine. May repeat in 2 hours if headache persists or recurs.    Patient has no known allergies.  Review of Systems: General:   Denies fever, chills, unexplained weight loss.  Optho/Auditory:   Denies visual changes, blurred vision/LOV Respiratory:   Denies SOB, DOE more than baseline levels.   Cardiovascular:   Denies chest pain, palpitations, new onset peripheral edema  Gastrointestinal:   Denies nausea, vomiting, diarrhea.  Genitourinary: Denies dysuria, freq/ urgency, flank pain Endocrine:     Denies hot or cold intolerance, polyuria, polydipsia. Musculoskeletal:   Denies unexplained myalgias, joint swelling, unexplained arthralgias, gait problems.  Skin:  Denies suspicious lesions, +rash Neurological:     Denies dizziness, unexplained weakness, numbness, +sleep issues (not using CPAP machine) Psychiatric/Behavioral:   Denies mood changes, suicidal or homicidal ideations, hallucinations    Objective:     Blood pressure 136/83, pulse 63, height 5\' 8"  (1.727 m), weight 210 lb 3.2 oz (95.3 kg), SpO2 100 %. Body mass index is 31.96 kg/m. General Appearance:    Alert, cooperative, no distress, appears stated age  Head:    Normocephalic, without obvious abnormality, atraumatic  Eyes:    PERRL, conjunctiva/corneas clear, EOM's intact, both eyes  Ears:    Normal TM's and external ear canals, both ears  Nose:   Nares normal, septum midline, mucosa normal, no drainage    or sinus  tenderness  Throat:   Lips w/o lesion, mucosa moist, and tongue normal; teeth and gums normal  Neck:   Supple, symmetrical, trachea midline, no adenopathy;    thyroid: no enlargement/tenderness/nodules; no carotid   bruit or JVD  Back:     Symmetric, no curvature, ROM normal, no CVA tenderness  Lungs:     Clear to auscultation bilaterally, respirations unlabored, no  Wh/ R/ R  Chest Wall:    No tenderness or gross deformity; normal excursion   Heart:    Regular rate and rhythm, S1 and S2 normal, no murmur, rub   or gallop  Abdomen:     Soft, non-tender, bowel sounds active all four quadrants, No G/R/R, no masses, no organomegaly  Genitalia:    Deferred by pt.  Rectal:    Deferred by pt.  Extremities:   Extremities normal, atraumatic, no cyanosis or gross edema  Pulses:   2+ and symmetric all extremities  Skin:   Warm, dry, Skin color, texture, turgor normal, no obvious lesions, scaly rash noted on both knees  M-Sk:   Ambulates * 4 w/o difficulty, no gross deformities, tone WNL  Neurologic:   CNII-XII intact, normal strength, sensation and reflexes    Throughout Psych:  No HI/SI, judgement and insight good, Euthymic mood. Full Affect.

## 2020-04-08 LAB — COMPREHENSIVE METABOLIC PANEL
ALT: 33 IU/L (ref 0–44)
AST: 42 IU/L — ABNORMAL HIGH (ref 0–40)
Albumin/Globulin Ratio: 1.5 (ref 1.2–2.2)
Albumin: 4.3 g/dL (ref 3.8–4.9)
Alkaline Phosphatase: 74 IU/L (ref 44–121)
BUN/Creatinine Ratio: 11 (ref 9–20)
BUN: 12 mg/dL (ref 6–24)
Bilirubin Total: 0.6 mg/dL (ref 0.0–1.2)
CO2: 22 mmol/L (ref 20–29)
Calcium: 9.5 mg/dL (ref 8.7–10.2)
Chloride: 100 mmol/L (ref 96–106)
Creatinine, Ser: 1.12 mg/dL (ref 0.76–1.27)
GFR calc Af Amer: 86 mL/min/{1.73_m2} (ref >59)
GFR calc non Af Amer: 74 mL/min/{1.73_m2} (ref >59)
Globulin, Total: 2.9 g/dL (ref 1.5–4.5)
Glucose: 109 mg/dL — ABNORMAL HIGH (ref 65–99)
Potassium: 3.9 mmol/L (ref 3.5–5.2)
Sodium: 136 mmol/L (ref 134–144)
Total Protein: 7.2 g/dL (ref 6.0–8.5)

## 2020-04-08 LAB — LIPID PANEL
Chol/HDL Ratio: 3 ratio (ref 0.0–5.0)
Cholesterol, Total: 143 mg/dL (ref 100–199)
HDL: 48 mg/dL (ref >39)
LDL Chol Calc (NIH): 79 mg/dL (ref 0–99)
Triglycerides: 84 mg/dL (ref 0–149)
VLDL Cholesterol Cal: 16 mg/dL (ref 5–40)

## 2020-04-08 LAB — TSH: TSH: 0.987 u[IU]/mL (ref 0.450–4.500)

## 2020-04-08 LAB — CBC
Hematocrit: 45.7 % (ref 37.5–51.0)
Hemoglobin: 16.2 g/dL (ref 13.0–17.7)
MCH: 30.2 pg (ref 26.6–33.0)
MCHC: 35.4 g/dL (ref 31.5–35.7)
MCV: 85 fL (ref 79–97)
Platelets: 238 10*3/uL (ref 150–450)
RBC: 5.36 x10E6/uL (ref 4.14–5.80)
RDW: 12.8 % (ref 11.6–15.4)
WBC: 5.9 10*3/uL (ref 3.4–10.8)

## 2020-04-08 LAB — PSA: Prostate Specific Ag, Serum: 0.4 ng/mL (ref 0.0–4.0)

## 2020-04-08 LAB — HEMOGLOBIN A1C
Est. average glucose Bld gHb Est-mCnc: 120 mg/dL
Hgb A1c MFr Bld: 5.8 % — ABNORMAL HIGH (ref 4.8–5.6)

## 2020-05-12 ENCOUNTER — Institutional Professional Consult (permissible substitution): Payer: 59 | Admitting: Neurology

## 2020-07-06 ENCOUNTER — Telehealth: Payer: Self-pay | Admitting: Physician Assistant

## 2020-07-06 NOTE — Telephone Encounter (Signed)
Patient was seen in office back in December and received a bill for a physical which should have been covered by insurance.

## 2020-08-31 ENCOUNTER — Telehealth: Payer: Self-pay | Admitting: Physician Assistant

## 2020-08-31 NOTE — Telephone Encounter (Signed)
Patient called in that his elbow is paining him- no other symptoms, he described as a sprang in the elbow. I advised that there are no available appointments this week- advised urgent care. Patient understood

## 2020-09-08 ENCOUNTER — Encounter: Payer: Self-pay | Admitting: Dermatology

## 2020-09-08 ENCOUNTER — Ambulatory Visit (INDEPENDENT_AMBULATORY_CARE_PROVIDER_SITE_OTHER): Payer: 59 | Admitting: Dermatology

## 2020-09-08 ENCOUNTER — Other Ambulatory Visit: Payer: Self-pay

## 2020-09-08 DIAGNOSIS — L821 Other seborrheic keratosis: Secondary | ICD-10-CM

## 2020-09-08 DIAGNOSIS — R21 Rash and other nonspecific skin eruption: Secondary | ICD-10-CM | POA: Diagnosis not present

## 2020-09-08 MED ORDER — TRIAMCINOLONE ACETONIDE 0.1 % EX CREA: TOPICAL_CREAM | CUTANEOUS | 1 refills | Status: DC

## 2020-09-08 NOTE — Patient Instructions (Signed)
Over the counter hydrocortisone ointment (NOT CREAM) DAILY FOR THE FACE   TRIAMCINOLONE CREAM OK FOR THE BODY (NOT FOR FACE OR SKIN FOLDS/UNDER ARM, GROIN)

## 2020-09-19 ENCOUNTER — Encounter: Payer: Self-pay | Admitting: Dermatology

## 2020-09-19 NOTE — Progress Notes (Signed)
   New Patient   Subjective  Lance Long is a 55 y.o. male who presents for the following: New Patient (Initial Visit) (Patient here today for skin check. Per patient he does have a history of eczema, patient states that he has peeling inside both ears, between eyebrows and nose. Per patient his PCP wanted him to have his knees and elbows checked. No family history or personal history of atypical moles, melanoma or non mole skin cancer.).  Longstanding history of rashes on face and ears with more recent involvement of knee and elbow areas. Location:  Duration:  Quality:  Associated Signs/Symptoms: Modifying Factors:  Severity:  Timing: Context:    The following portions of the chart were reviewed this encounter and updated as appropriate:  Tobacco  Allergies  Meds  Problems  Med Hx  Surg Hx  Fam Hx      Objective  Well appearing patient in no apparent distress; mood and affect are within normal limits. Objective  Mid Back: UPPER BACK:: Flattopped brown 6 mm textured papule  Objective  Left Elbow - Posterior, Left Knee - Anterior, Right Elbow - Posterior, Right Knee - Anterior: RASH/ HISTORY OF ECZEMA: Today there is psoriasiform plus eczematous dermatitis on the arms and legs.  More subtle dermatitis ears and face suggestive of seborrheic eczema.    A focused examination was performed including Head, neck, back, ears, legs, nails.. Relevant physical exam findings are noted in the Assessment and Plan.   Assessment & Plan  Seborrheic keratosis Mid Back  Intervention not currently necessary.  Rash (4) Left Elbow - Posterior; Right Elbow - Posterior; Left Knee - Anterior; Right Knee - Anterior  Daily triamcinolone cream for arms and legs after bathing for 3 weeks.  May use over-the-counter 1% hydrocortisone for the facial involvement.  Follow-up by MyChart at that time.  triamcinolone cream (KENALOG) 0.1 % - Left Elbow - Posterior, Left Knee - Anterior, Right Elbow  - Posterior, Right Knee - Anterior

## 2020-10-06 ENCOUNTER — Other Ambulatory Visit: Payer: Self-pay

## 2020-10-06 ENCOUNTER — Encounter: Payer: Self-pay | Admitting: Physician Assistant

## 2020-10-06 ENCOUNTER — Ambulatory Visit (INDEPENDENT_AMBULATORY_CARE_PROVIDER_SITE_OTHER): Payer: 59 | Admitting: Physician Assistant

## 2020-10-06 VITALS — BP 124/82 | HR 62 | Temp 97.1°F | Ht 70.0 in | Wt 204.5 lb

## 2020-10-06 DIAGNOSIS — I10 Essential (primary) hypertension: Secondary | ICD-10-CM

## 2020-10-06 DIAGNOSIS — R5383 Other fatigue: Secondary | ICD-10-CM

## 2020-10-06 DIAGNOSIS — E7800 Pure hypercholesterolemia, unspecified: Secondary | ICD-10-CM | POA: Diagnosis not present

## 2020-10-06 NOTE — Progress Notes (Signed)
Established Patient Office Visit  Subjective:  Patient ID: Lance Long, male    DOB: 1965-11-11  Age: 55 y.o. MRN: [id number]  CC:  Chief Complaint  Patient presents with  . Follow-up  . Hypertension  . Hyperlipidemia    HPI Lance Long presents for follow up on hypertension and hyperlipidemia. Patient c/o fatigue for the past couple of weeks. Denies lifestyle change, starting new medications/supplements or other systemic symptoms such as fever, night sweats, or unintentional weight loss. Patient reports does have a CPAP machine for sleep apnea which he uses 3 times per week. States has a hard time sleeping with CPAP which he has had for a while. Reports does not notice a different with tiredness the next day when he does wear his CPAP.  HTN: Pt denies chest pain, palpitations, dizziness or edema. Taking medication as directed without side effects. Has not been checking BP at home lately. Pt follows a low salt diet. Reports good hydration.  HLD: Pt taking medication as directed without issues. Denies side effects including myalgias and RUQ pain. States limits red meat and fried foods. Stays active with doing work outside and goes to the gym 2-3 times per week and does cardio and weight lifting.    Past Medical History:  Diagnosis Date  . Folliculitis barbae   . Hypertension   . Migraine   . OSA (obstructive sleep apnea)   . Sleep apnea    does not use c-pap machine    Past Surgical History:  Procedure Laterality Date  . FOOT SURGERY Left 11.22.13  . TMJ JAW  08/1998    Family History  Problem Relation Age of Onset  . Hypertension Mother   . Fibromyalgia Mother   . Diabetes Mother   . Liver cancer Father   . Heart attack Maternal Grandmother   . Diabetes Maternal Grandmother   . Hypertension Maternal Grandmother   . Stroke Maternal Grandmother   . Liver disease Maternal Grandfather   . Healthy Sister   . Healthy Brother   . Colon cancer Neg Hx   . Colon  polyps Neg Hx   . Esophageal cancer Neg Hx   . Rectal cancer Neg Hx   . Stomach cancer Neg Hx     Social History   Socioeconomic History  . Marital status: Married    Spouse name: Not on file  . Number of children: Not on file  . Years of education: Not on file  . Highest education level: Not on file  Occupational History  . Not on file  Tobacco Use  . Smoking status: Never Smoker  . Smokeless tobacco: Never Used  Vaping Use  . Vaping Use: Never used  Substance and Sexual Activity  . Alcohol use: Yes    Alcohol/week: 2.0 standard drinks    Types: 2 Glasses of wine per week    Comment: occasional  . Drug use: No  . Sexual activity: Not Currently  Other Topics Concern  . Not on file  Social History Narrative  . Not on file   Social Determinants of Health   Financial Resource Strain: Not on file  Food Insecurity: Not on file  Transportation Needs: Not on file  Physical Activity: Not on file  Stress: Not on file  Social Connections: Not on file  Intimate Partner Violence: Not on file    Outpatient Medications Prior to Visit  Medication Sig Dispense Refill  . atorvastatin (LIPITOR) 20 MG tablet Take 1 tablet by  mouth twice a week. 90 tablet 1  . hydrochlorothiazide (HYDRODIURIL) 25 MG tablet Take 1 tablet (25 mg total) by mouth daily. 90 tablet 1  . ibuprofen (ADVIL) 800 MG tablet Take 1 tablet (800 mg total) by mouth every 8 (eight) hours as needed. 60 tablet 2  . Multiple Vitamin (MULTI VITAMIN DAILY) TABS Take by mouth daily.    . sildenafil (VIAGRA) 50 MG tablet TAKE 1/2 TO 1 TABLET 30MINS TO 4 HOURS PRIOR TO SEXUAL ACTIVITY 8 tablet 1  . triamcinolone cream (KENALOG) 0.1 % APPLY TO KNEE AND ELBOWS ONCE TO TWICE DAILY (BEST TIME AFTER BATH) 454 g 1   No facility-administered medications prior to visit.    No Known Allergies  ROS Review of Systems Review of Systems:  A fourteen system review of systems was performed and found to be positive as per HPI.     Objective:    Physical Exam General:  Well Developed, well nourished, appropriate for stated age.  Neuro:  Alert and oriented,  extra-ocular muscles intact  HEENT:  Normocephalic, atraumatic, neck supple Skin:  no gross rash, warm, pink. Cardiac:  RRR, S1 S2 Respiratory:  ECTA B/L, Not using accessory muscles, speaking in full sentences- unlabored. Vascular:  Ext warm, no cyanosis apprec.; cap RF less 2 sec. Psych:  No HI/SI, judgement and insight good, Euthymic mood. Full Affect.   BP 124/82   Pulse 62   Temp (!) 97.1 F (36.2 C)   Ht _0  (1.778 m)   Wt 204 lb 8 oz (92.8 kg)   SpO2 100%   BMI 29.34 kg/m  Wt Readings from Last 3 Encounters:  10/06/20 204 lb 8 oz (92.8 kg)  04/07/20 210 lb 3.2 oz (95.3 kg)  03/07/19 204 lb 11.2 oz (92.9 kg)     Health Maintenance Due  Topic Date Due  . Pneumococcal Vaccine 58-4 Years old (1 of 4 - PCV13) Never done  . Zoster Vaccines- Shingrix (1 of 2) Never done  . COVID-19 Vaccine (3 - Pfizer risk 4-dose series) 01/26/2020    There are no preventive care reminders to display for this patient.  Lab Results  Component Value Date   TSH 0.987 04/07/2020   Lab Results  Component Value Date   WBC 5.9 04/07/2020   HGB 16.2 04/07/2020   HCT 45.7 04/07/2020   MCV 85 04/07/2020   PLT 238 04/07/2020   Lab Results  Component Value Date   NA 136 04/07/2020   K 3.9 04/07/2020   CO2 22 04/07/2020   GLUCOSE 109 (H) 04/07/2020   BUN 12 04/07/2020   CREATININE 1.12 04/07/2020   BILITOT 0.6 04/07/2020   ALKPHOS 74 04/07/2020   AST 42 (H) 04/07/2020   ALT 33 04/07/2020   PROT 7.2 04/07/2020   ALBUMIN 4.3 04/07/2020   CALCIUM 9.5 04/07/2020   Lab Results  Component Value Date   CHOL 143 04/07/2020   Lab Results  Component Value Date   HDL 48 04/07/2020   Lab Results  Component Value Date   LDLCALC 79 04/07/2020   Lab Results  Component Value Date   TRIG 84 04/07/2020   Lab Results  Component Value Date   CHOLHDL  3.0 04/07/2020   Lab Results  Component Value Date   HGBA1C 5.8 (H) 04/07/2020      Assessment & Plan:   Problem List Items Addressed This Visit      Cardiovascular and Mediastinum   Benign essential HTN - Primary  Relevant Orders   Comp Met (CMET)     Other   Elevated LDL cholesterol level    Other Visit Diagnoses    Fatigue, unspecified type       Relevant Orders   Comp Met (CMET)   CBC w/Diff   TSH   Vitamin D (25 hydroxy)   B12 and Folate Panel     Benign essential hypertension: -Controlled. -Continue current medication regimen. Will collect CMP for medication monitoring. -Will continue to monitor.  Elevated LDL cholesterol level: -Last lipid panel: total cholesterol 143, triglycerides 84, HDL 48, LDL 79 -Continue current medication regimen. Will repeat hepatic function for medication monitoring. -Continue heart healthy diet and physical activity regimen. -Will continue to monitor.  Fatigue: -Etiology unclear. -Will place lab orders to evaluate for potential etiology (metobolic, endocrine or nutritional def). -Recommend to improve compliance with CPAP.    No orders of the defined types were placed in this encounter.   Follow-up: Return in about 6 months (around 04/07/2021) for CPE and FBW few days prior .   Note:  This note was prepared with assistance of Dragon voice recognition software. Occasional wrong-word or sound-a-like substitutions may have occurred due to the inherent limitations of voice recognition software.  Lorrene Reid, PA-C

## 2020-10-06 NOTE — Patient Instructions (Signed)

## 2020-10-07 LAB — CBC WITH DIFFERENTIAL/PLATELET
Basophils Absolute: 0.1 10*3/uL (ref 0.0–0.2)
Basos: 1 %
EOS (ABSOLUTE): 0.1 10*3/uL (ref 0.0–0.4)
Eos: 2 %
Hematocrit: 44.9 % (ref 37.5–51.0)
Hemoglobin: 15.7 g/dL (ref 13.0–17.7)
Immature Grans (Abs): 0 10*3/uL (ref 0.0–0.1)
Immature Granulocytes: 0 %
Lymphocytes Absolute: 2.1 10*3/uL (ref 0.7–3.1)
Lymphs: 38 %
MCH: 30 pg (ref 26.6–33.0)
MCHC: 35 g/dL (ref 31.5–35.7)
MCV: 86 fL (ref 79–97)
Monocytes Absolute: 0.6 10*3/uL (ref 0.1–0.9)
Monocytes: 10 %
Neutrophils Absolute: 2.7 10*3/uL (ref 1.4–7.0)
Neutrophils: 49 %
Platelets: 201 10*3/uL (ref 150–450)
RBC: 5.24 x10E6/uL (ref 4.14–5.80)
RDW: 12.8 % (ref 11.6–15.4)
WBC: 5.4 10*3/uL (ref 3.4–10.8)

## 2020-10-07 LAB — COMPREHENSIVE METABOLIC PANEL
ALT: 25 IU/L (ref 0–44)
AST: 31 IU/L (ref 0–40)
Albumin/Globulin Ratio: 1.4 (ref 1.2–2.2)
Albumin: 4.4 g/dL (ref 3.8–4.9)
Alkaline Phosphatase: 76 IU/L (ref 44–121)
BUN/Creatinine Ratio: 12 (ref 9–20)
BUN: 13 mg/dL (ref 6–24)
Bilirubin Total: 0.5 mg/dL (ref 0.0–1.2)
CO2: 23 mmol/L (ref 20–29)
Calcium: 9.3 mg/dL (ref 8.7–10.2)
Chloride: 99 mmol/L (ref 96–106)
Creatinine, Ser: 1.09 mg/dL (ref 0.76–1.27)
Globulin, Total: 3.2 g/dL (ref 1.5–4.5)
Glucose: 78 mg/dL (ref 65–99)
Potassium: 4 mmol/L (ref 3.5–5.2)
Sodium: 138 mmol/L (ref 134–144)
Total Protein: 7.6 g/dL (ref 6.0–8.5)
eGFR: 81 mL/min/{1.73_m2} (ref >59)

## 2020-10-07 LAB — VITAMIN D 25 HYDROXY (VIT D DEFICIENCY, FRACTURES): Vit D, 25-Hydroxy: 41.8 ng/mL (ref 30.0–100.0)

## 2020-10-07 LAB — B12 AND FOLATE PANEL
Folate: 16.3 ng/mL (ref >3.0)
Vitamin B-12: 606 pg/mL (ref 232–1245)

## 2020-10-07 LAB — TSH: TSH: 0.71 u[IU]/mL (ref 0.450–4.500)

## 2020-10-10 ENCOUNTER — Other Ambulatory Visit: Payer: Self-pay | Admitting: Physician Assistant

## 2021-01-08 ENCOUNTER — Other Ambulatory Visit: Payer: Self-pay | Admitting: Physician Assistant

## 2021-03-30 ENCOUNTER — Other Ambulatory Visit: Payer: Self-pay

## 2021-03-30 DIAGNOSIS — E7800 Pure hypercholesterolemia, unspecified: Secondary | ICD-10-CM

## 2021-03-30 DIAGNOSIS — Z0000 Encounter for general adult medical examination without abnormal findings: Secondary | ICD-10-CM

## 2021-03-30 DIAGNOSIS — I10 Essential (primary) hypertension: Secondary | ICD-10-CM

## 2021-03-31 ENCOUNTER — Other Ambulatory Visit: Payer: 59

## 2021-03-31 ENCOUNTER — Other Ambulatory Visit: Payer: Self-pay

## 2021-03-31 DIAGNOSIS — E7800 Pure hypercholesterolemia, unspecified: Secondary | ICD-10-CM

## 2021-03-31 DIAGNOSIS — Z0000 Encounter for general adult medical examination without abnormal findings: Secondary | ICD-10-CM

## 2021-03-31 DIAGNOSIS — I10 Essential (primary) hypertension: Secondary | ICD-10-CM

## 2021-04-01 LAB — CBC WITH DIFFERENTIAL/PLATELET
Basophils Absolute: 0.1 10*3/uL (ref 0.0–0.2)
Basos: 1 %
EOS (ABSOLUTE): 0.1 10*3/uL (ref 0.0–0.4)
Eos: 1 %
Hematocrit: 47.2 % (ref 37.5–51.0)
Hemoglobin: 16.1 g/dL (ref 13.0–17.7)
Immature Grans (Abs): 0 10*3/uL (ref 0.0–0.1)
Immature Granulocytes: 0 %
Lymphocytes Absolute: 2.4 10*3/uL (ref 0.7–3.1)
Lymphs: 42 %
MCH: 29.7 pg (ref 26.6–33.0)
MCHC: 34.1 g/dL (ref 31.5–35.7)
MCV: 87 fL (ref 79–97)
Monocytes Absolute: 0.6 10*3/uL (ref 0.1–0.9)
Monocytes: 11 %
Neutrophils Absolute: 2.5 10*3/uL (ref 1.4–7.0)
Neutrophils: 45 %
Platelets: 189 10*3/uL (ref 150–450)
RBC: 5.42 x10E6/uL (ref 4.14–5.80)
RDW: 13.1 % (ref 11.6–15.4)
WBC: 5.6 10*3/uL (ref 3.4–10.8)

## 2021-04-01 LAB — LIPID PANEL
Chol/HDL Ratio: 3.5 ratio (ref 0.0–5.0)
Cholesterol, Total: 145 mg/dL (ref 100–199)
HDL: 41 mg/dL (ref >39)
LDL Chol Calc (NIH): 89 mg/dL (ref 0–99)
Triglycerides: 74 mg/dL (ref 0–149)
VLDL Cholesterol Cal: 15 mg/dL (ref 5–40)

## 2021-04-01 LAB — COMPREHENSIVE METABOLIC PANEL
ALT: 41 IU/L (ref 0–44)
AST: 79 IU/L — ABNORMAL HIGH (ref 0–40)
Albumin/Globulin Ratio: 1.4 (ref 1.2–2.2)
Albumin: 4.4 g/dL (ref 3.8–4.9)
Alkaline Phosphatase: 73 IU/L (ref 44–121)
BUN/Creatinine Ratio: 12 (ref 9–20)
BUN: 14 mg/dL (ref 6–24)
Bilirubin Total: 0.6 mg/dL (ref 0.0–1.2)
CO2: 25 mmol/L (ref 20–29)
Calcium: 9.4 mg/dL (ref 8.7–10.2)
Chloride: 99 mmol/L (ref 96–106)
Creatinine, Ser: 1.17 mg/dL (ref 0.76–1.27)
Globulin, Total: 3.1 g/dL (ref 1.5–4.5)
Glucose: 97 mg/dL (ref 70–99)
Potassium: 4.8 mmol/L (ref 3.5–5.2)
Sodium: 136 mmol/L (ref 134–144)
Total Protein: 7.5 g/dL (ref 6.0–8.5)
eGFR: 74 mL/min/{1.73_m2} (ref >59)

## 2021-04-01 LAB — HEMOGLOBIN A1C
Est. average glucose Bld gHb Est-mCnc: 117 mg/dL
Hgb A1c MFr Bld: 5.7 % — ABNORMAL HIGH (ref 4.8–5.6)

## 2021-04-01 LAB — TSH: TSH: 1.52 u[IU]/mL (ref 0.450–4.500)

## 2021-04-07 ENCOUNTER — Ambulatory Visit (INDEPENDENT_AMBULATORY_CARE_PROVIDER_SITE_OTHER): Payer: 59 | Admitting: Physician Assistant

## 2021-04-07 ENCOUNTER — Encounter: Payer: Self-pay | Admitting: Physician Assistant

## 2021-04-07 ENCOUNTER — Other Ambulatory Visit: Payer: Self-pay

## 2021-04-07 VITALS — BP 132/74 | HR 78 | Temp 97.9°F | Ht 70.0 in | Wt 213.8 lb

## 2021-04-07 DIAGNOSIS — Z0000 Encounter for general adult medical examination without abnormal findings: Secondary | ICD-10-CM | POA: Diagnosis not present

## 2021-04-07 DIAGNOSIS — I10 Essential (primary) hypertension: Secondary | ICD-10-CM

## 2021-04-07 DIAGNOSIS — E7800 Pure hypercholesterolemia, unspecified: Secondary | ICD-10-CM | POA: Diagnosis not present

## 2021-04-07 DIAGNOSIS — R748 Abnormal levels of other serum enzymes: Secondary | ICD-10-CM

## 2021-04-07 DIAGNOSIS — Z23 Encounter for immunization: Secondary | ICD-10-CM

## 2021-04-07 DIAGNOSIS — Z114 Encounter for screening for human immunodeficiency virus [HIV]: Secondary | ICD-10-CM

## 2021-04-07 DIAGNOSIS — Z1159 Encounter for screening for other viral diseases: Secondary | ICD-10-CM

## 2021-04-07 NOTE — Progress Notes (Signed)
Male physical   Impression and Recommendations:    1. Healthcare maintenance   2. Benign essential HTN   3. Elevated LDL cholesterol level   4. Need for shingles vaccine   5. Need for influenza vaccination   6. Encounter for screening for HIV   7. Need for hepatitis C screening test   8. Elevated liver enzymes      1) Anticipatory Guidance: Skin CA prevention- recommend to use sunscreen when outside along with skin surveillance; eat a balanced and modest diet; physical activity at least 25 minutes per day or minimum of 150 min/ week moderate to intense activity.  2) Immunizations / Screenings / Labs:   All immunizations are up-to-date per recommendations or will be updated today if pt allows.    - Patient understands with dental and vision screens they will schedule independently.  - Obtained CBC, CMP, HgA1c, Lipid panel, TSH. Discussed with patient recent lab results which are essentially within normal limits or stable from prior with exception of AST which is elevated. Discussed with patient reducing and limiting hepatotoxic substances. Pt agreeable to Hep C and HIV screenings. Will repeat liver function in 12 weeks, if AST remains elevated recommend further evaluation with liver ultrasound. A1c has improved. - Pt agreeable to Shingrix and influenza vaccine. - UTD colonoscopy.   3) Weight:  Recommend to improve diet habits to improve overall feelings of well being and objective health data. Improve nutrient density of diet through increasing intake of fruits and vegetables and decreasing saturated fats, white flour products and refined sugars.   4) Healthcare Maintenance: -BP and pulse stable.  -Continue current medication regimen. Renal function and electrolytes normal, ALT normal at 41. -Follow up in 3 months for HTN, HLD, elevated liver enzyme and CMP few days prior to visit.  Orders Placed This Encounter  Procedures   Varicella-zoster vaccine IM (Shingrix)   Flu  Vaccine QUAD 6+ mos PF IM (Fluarix Quad PF)   Hepatitis C Antibody   HIV antibody (with reflex)    No orders of the defined types were placed in this encounter.    Return in about 3 months (around 07/06/2021) for HTN, HLD, second dose of shingles, elevated liver enzyme and CMP before visit.    Gross side effects, risk and benefits, and alternatives of medications discussed with patient.  Patient is aware that all medications have potential side effects and we are unable to predict every side effect or drug-drug interaction that may occur.  Expresses verbal understanding and consents to current therapy plan and treatment regimen.  Please see AVS handed out to patient at the end of our visit for further patient instructions/ counseling done pertaining to today's office visit.       Subjective:        CC: CPE   HPI: Lance Long is a 55 y.o. male who presents to [facility] today for a yearly health maintenance exam.     Health Maintenance Summary  - Reviewed and updated, unless pt declines services.  Last Cologuard or Colonoscopy:   02/09/2018- repeat in 10 years Tobacco History Reviewed:  yes, never a smoker Abdominal Ultrasound:   n/a   CT scan for screening lung CA:   n/a  Alcohol / drug use: about 2 drinks/wk Eye exams: y Male history: STD concerns:   none Additional penile/ urinary concerns: none   Additional concerns beyond Health Maintenance issues: none    Immunization History  Administered Date(s) Administered   Influenza,inj,Quad PF,6+ Mos 02/26/2018, 03/07/2019, 04/07/2020, 04/07/2021   PFIZER(Purple Top)SARS-COV-2 Vaccination 12/08/2019, 12/29/2019   Tdap 08/15/2007, 02/26/2018   Zoster Recombinat (Shingrix) 04/07/2021     Health Maintenance  Topic Date Due   Pneumococcal Vaccine 39-31 Years old (1 - PCV) Never done   COVID-19 Vaccine (3 - Pfizer risk series) 01/26/2020   Zoster Vaccines- Shingrix (2 of 2)  06/02/2021   COLONOSCOPY (Pts 45-37yrs Insurance coverage will need to be confirmed)  02/10/2028   TETANUS/TDAP  02/27/2028   INFLUENZA VACCINE  Completed   Hepatitis C Screening  Completed   HIV Screening  Completed   HPV VACCINES  Aged Out       Wt Readings from Last 3 Encounters:  04/07/21 213 lb 12.8 oz (97 kg)  10/06/20 204 lb 8 oz (92.8 kg)  04/07/20 210 lb 3.2 oz (95.3 kg)   BP Readings from Last 3 Encounters:  04/07/21 132/74  10/06/20 124/82  04/07/20 136/83   Pulse Readings from Last 3 Encounters:  04/07/21 78  10/06/20 62  04/07/20 63    Patient Active Problem List   Diagnosis Date Noted   Peripheral neuralgia 04/07/2020   Allergic rhinitis 04/07/2020   Elevated LDL cholesterol level 03/07/2019   Acute pain of right knee 11/14/2018   Leg numbness 06/13/2018   Leg weakness, bilateral 06/13/2018   Erectile dysfunction 02/26/2018   Chronic migraine 02/06/2018   Obstructive sleep apnea 02/06/2018   Migraine 12/19/2017   Healthcare maintenance 12/19/2017   OSA (obstructive sleep apnea) 10/29/2014   Benign essential HTN 10/29/2014   Obesity (BMI 30-39.9) 10/29/2014   Closed fracture of metatarsal bone(s) 02/12/2013    Past Medical History:  Diagnosis Date   Folliculitis barbae    Hypertension    Migraine    OSA (obstructive sleep apnea)    Sleep apnea    does not use c-pap machine    Past Surgical History:  Procedure Laterality Date   FOOT SURGERY Left 11.22.13   TMJ JAW  08/1998    Family History  Problem Relation Age of Onset   Hypertension Mother    Fibromyalgia Mother    Diabetes Mother    Liver cancer Father    Heart attack Maternal Grandmother    Diabetes Maternal Grandmother    Hypertension Maternal Grandmother    Stroke Maternal Grandmother    Liver disease Maternal Grandfather    Healthy Sister    Healthy Brother    Colon cancer Neg Hx    Colon polyps Neg Hx    Esophageal cancer Neg Hx    Rectal cancer Neg Hx    Stomach  cancer Neg Hx     Social History   Substance and Sexual Activity  Drug Use No  ,  Social History   Substance and Sexual Activity  Alcohol Use Yes   Alcohol/week: 2.0 standard drinks   Types: 2 Glasses of wine per week   Comment: occasional  ,  Social History   Tobacco Use  Smoking Status Never  Smokeless Tobacco Never  ,  Social History   Substance and Sexual Activity  Sexual Activity Not Currently    Patient's Medications  New Prescriptions   No medications on file  Previous Medications   ATORVASTATIN (LIPITOR) 20 MG TABLET    Take 1 tablet by mouth twice a week.   HYDROCHLOROTHIAZIDE (HYDRODIURIL) 25 MG TABLET    TAKE 1 TABLET BY MOUTH EVERY DAY   IBUPROFEN (ADVIL) 800 MG  TABLET    Take 1 tablet (800 mg total) by mouth every 8 (eight) hours as needed.   MULTIPLE VITAMIN (MULTI VITAMIN DAILY) TABS    Take by mouth daily.   SILDENAFIL (VIAGRA) 50 MG TABLET    TAKE 1/2 TO 1 TABLET TO 4 HOURS PRIOR TO SEXUAL ACTIVITY   TRIAMCINOLONE CREAM (KENALOG) 0.1 %    APPLY TO KNEE AND ELBOWS ONCE TO TWICE DAILY (BEST TIME AFTER BATH)  Modified Medications   No medications on file  Discontinued Medications   No medications on file    Patient has no known allergies.  Review of Systems: General:   Denies fever, chills, unexplained weight loss.  Optho/Auditory:   Denies visual changes, blurred vision/LOV Respiratory:   Denies SOB, DOE more than baseline levels.   Cardiovascular:   Denies chest pain, palpitations, new onset peripheral edema  Gastrointestinal:   Denies nausea, vomiting, diarrhea.  Genitourinary: Denies dysuria, freq/ urgency, flank pain  Endocrine:     Denies hot or cold intolerance, polyuria, polydipsia. Musculoskeletal:   Denies unexplained myalgias, joint swelling, unexplained arthralgias, gait problems.  Skin:  Denies rash, suspicious lesions Neurological:     Denies dizziness, unexplained weakness, numbness  Psychiatric/Behavioral:   Denies mood  changes, suicidal or homicidal ideations, hallucinations    Objective:     Blood pressure 132/74, pulse 78, temperature 97.9 F (36.6 C), height 5\' 10"  (1.778 m), weight 213 lb 12.8 oz (97 kg), SpO2 98 %. Body mass index is 30.68 kg/m. General Appearance:    Alert, cooperative, no distress, appears stated age  Head:    Normocephalic, without obvious abnormality, atraumatic  Eyes:    PERRL, conjunctiva/corneas clear, EOM's intact, fundi    benign, both eyes  Ears:    Normal TM's and external ear canals, both ears  Nose:   Nares normal, septum midline, mucosa normal, no drainage    or sinus tenderness  Throat:   Lips w/o lesion, mucosa moist, and tongue normal; teeth and gums normal  Neck:   Supple, symmetrical, trachea midline, no adenopathy;    thyroid:  no enlargement/tenderness/nodules; no JVD  Back:     Symmetric, no curvature, ROM normal, no CVA tenderness  Lungs:     Clear to auscultation bilaterally, respirations unlabored, no Wh/ R/ R  Chest Wall:    No tenderness or gross deformity; normal excursion   Heart:    Regular rate and rhythm, S1 and S2 normal, no murmur  Abdomen:     Soft, non-tender, bowel sounds active all four quadrants, No G/R/R, no masses, no organomegaly  Genitalia:   Deferred  Rectal:   Deferred  Extremities:   Extremities normal, atraumatic, no cyanosis or gross edema  Pulses:   2+ and symmetric all extremities  Skin:   Warm, dry, Skin color, texture, turgor normal, no obvious rashes or lesions  M-Sk:   Ambulates * 4 w/o difficulty, no gross deformities, tone WNL  Neurologic:   CNII-XII grossly intact Psych:  No HI/SI, judgement and insight good, Euthymic mood. Full Affect.

## 2021-04-07 NOTE — Patient Instructions (Signed)

## 2021-04-08 LAB — HEPATITIS C ANTIBODY: Hep C Virus Ab: 0.2 s/co ratio (ref 0.0–0.9)

## 2021-04-08 LAB — HIV ANTIBODY (ROUTINE TESTING W REFLEX): HIV Screen 4th Generation wRfx: NONREACTIVE

## 2021-04-15 ENCOUNTER — Other Ambulatory Visit: Payer: Self-pay | Admitting: Physician Assistant

## 2021-04-28 ENCOUNTER — Other Ambulatory Visit: Payer: Self-pay | Admitting: Physician Assistant

## 2021-04-28 DIAGNOSIS — E7800 Pure hypercholesterolemia, unspecified: Secondary | ICD-10-CM

## 2021-07-06 ENCOUNTER — Other Ambulatory Visit: Payer: Self-pay

## 2021-07-06 DIAGNOSIS — Z0000 Encounter for general adult medical examination without abnormal findings: Secondary | ICD-10-CM

## 2021-07-06 DIAGNOSIS — E7800 Pure hypercholesterolemia, unspecified: Secondary | ICD-10-CM

## 2021-07-06 DIAGNOSIS — I10 Essential (primary) hypertension: Secondary | ICD-10-CM

## 2021-07-08 ENCOUNTER — Other Ambulatory Visit: Payer: Self-pay

## 2021-07-08 ENCOUNTER — Other Ambulatory Visit: Payer: 59

## 2021-07-08 DIAGNOSIS — I10 Essential (primary) hypertension: Secondary | ICD-10-CM

## 2021-07-08 DIAGNOSIS — E7800 Pure hypercholesterolemia, unspecified: Secondary | ICD-10-CM

## 2021-07-08 DIAGNOSIS — Z0000 Encounter for general adult medical examination without abnormal findings: Secondary | ICD-10-CM

## 2021-07-09 LAB — COMPREHENSIVE METABOLIC PANEL
ALT: 30 IU/L (ref 0–44)
AST: 47 IU/L — ABNORMAL HIGH (ref 0–40)
Albumin/Globulin Ratio: 1.5 (ref 1.2–2.2)
Albumin: 4.3 g/dL (ref 3.8–4.9)
Alkaline Phosphatase: 85 IU/L (ref 44–121)
BUN/Creatinine Ratio: 11 (ref 9–20)
BUN: 13 mg/dL (ref 6–24)
Bilirubin Total: 0.7 mg/dL (ref 0.0–1.2)
CO2: 23 mmol/L (ref 20–29)
Calcium: 9.3 mg/dL (ref 8.7–10.2)
Chloride: 105 mmol/L (ref 96–106)
Creatinine, Ser: 1.2 mg/dL (ref 0.76–1.27)
Globulin, Total: 2.8 g/dL (ref 1.5–4.5)
Glucose: 96 mg/dL (ref 70–99)
Potassium: 4.8 mmol/L (ref 3.5–5.2)
Sodium: 139 mmol/L (ref 134–144)
Total Protein: 7.1 g/dL (ref 6.0–8.5)
eGFR: 71 mL/min/{1.73_m2} (ref >59)

## 2021-07-14 NOTE — Progress Notes (Signed)
?Established patient visit ? ? ?Patient: Lance Long   DOB: January 07, 1966   56 y.o. Male  MRN: [id number] ?Visit Date: 07/15/2021 ? ?Chief Complaint  ?Patient presents with  ? Follow-up  ? Hyperlipidemia  ? Hypertension  ? ?Subjective  ?  ?HPI  ?Patient presents for follow-up on hypertension, hyperlipidemia and elevated liver enzymes. ? ?HTN: Pt denies chest pain, palpitations, dizziness, shortness of breath, syncope or lower extremity swelling. Reports woke up with a frontal headache this morning. Taking medication as directed without side effects.  ? ?HLD: Pt taking medication as directed without issues. States has reduced fried foods and has been more active.  ? ?Elevated liver enzymes: Patient reports has reduced fatty foods and working out more. Goes to they gym 5-7 times per week and does cardio and light weight. Also walks about 10,000 steps daily. Denies abdominal pain, nausea, vomiting or altered bowel habits. Drinks 1-2 glasses of wine on the weekends.  ? ? ?Medications: ?Outpatient Medications Prior to Visit  ?Medication Sig  ? atorvastatin (LIPITOR) 20 MG tablet TAKE 1 TABLET BY MOUTH TWICE WEEKLY  ? hydrochlorothiazide (HYDRODIURIL) 25 MG tablet TAKE 1 TABLET BY MOUTH EVERY DAY  ? ibuprofen (ADVIL) 800 MG tablet Take 1 tablet (800 mg total) by mouth every 8 (eight) hours as needed.  ? Multiple Vitamin (MULTI VITAMIN DAILY) TABS Take by mouth daily.  ? sildenafil (VIAGRA) 50 MG tablet TAKE 1/2 TO 1 TABLET TO 4 HOURS PRIOR TO SEXUAL ACTIVITY  ? triamcinolone cream (KENALOG) 0.1 % APPLY TO KNEE AND ELBOWS ONCE TO TWICE DAILY (BEST TIME AFTER BATH)  ? ?No facility-administered medications prior to visit.  ? ? ?Review of Systems ?Review of Systems:  ?A fourteen system review of systems was performed and found to be positive as per HPI. ? ? ? ?  Objective  ?  ?BP (!) 142/79   Pulse 68   Temp 98 ?F (36.7 ?C)   Ht 5\' 10"  (1.778 m)   Wt 214 lb (97.1 kg)   SpO2 98%   BMI 30.71 kg/m?  ?BP Readings from  Last 3 Encounters:  ?07/15/21 (!) 142/79  ?04/07/21 132/74  ?10/06/20 124/82  ? ?Wt Readings from Last 3 Encounters:  ?07/15/21 214 lb (97.1 kg)  ?04/07/21 213 lb 12.8 oz (97 kg)  ?10/06/20 204 lb 8 oz (92.8 kg)  ? ? ?Physical Exam  ?General:  Well Developed, well nourished, appropriate for stated age.  ?Neuro:  Alert and oriented,  extra-ocular muscles intact, no focal deficits  ?HEENT:  Normocephalic, atraumatic, neck supple  ?Skin:  no gross rash, warm, pink. ?Cardiac:  RRR, S1 S2 ?Respiratory: CTA B/L  ?Vascular:  Ext warm, no cyanosis apprec.; cap RF less 2 sec. ?Psych:  No HI/SI, judgement and insight good, Euthymic mood. Full Affect. ? ? ?No results found for any visits on 07/15/21. ? Assessment & Plan  ?  ? ? ?Problem List Items Addressed This Visit   ? ?  ? Cardiovascular and Mediastinum  ? Benign essential HTN - Primary  ?  -BP elevated on intake, BP repeated and systolic improved but diastolic remains elevated. Discussed to check BP at home daily for the next week and forward BP readings. If BP remains >130/80 then recommend increasing HCTZ to 50 mg. Pt verbalized understanding. ?-Recommend to stay well hydrated. ?-Recent CMP, renal function and electrolytes normal. ?-Will continue to monitor. ?  ?  ?  ? Other  ? Elevated LDL cholesterol level  ?  -  Last lipid panel: HDL 41, LDL 89 ?-Continue current medication regimen.  ?-Recommend to continue with diet changes and increased exercise. ?-Will repeat lipid panel at follow-up visit.  ?-Recent CMP, liver function has mildly improved. Will continue to monitor. ?  ?  ? ?Other Visit Diagnoses   ? ? Elevated liver enzymes      ? Need for shingles vaccine      ? Relevant Orders  ? Varicella-zoster vaccine IM (Completed)  ? ?  ? ?Elevated liver enzymes: ?-Discussed recent CMP, AST has decreased from 79 to 47, ALT normal at 30. Recommend to continue with low fat diet and low alcohol use. Will continue to monitor. ? ?Return in about 4 months (around 11/14/2021) for  HTN, HLD and FBW (lipid panel, cmp).  ?   ? ? ? ?Mayer Masker, PA-C  ?[facility] ?[phone] (phone) ?[phone] (fax) ? ?[facility] ?

## 2021-07-15 ENCOUNTER — Ambulatory Visit (INDEPENDENT_AMBULATORY_CARE_PROVIDER_SITE_OTHER): Payer: 59 | Admitting: Physician Assistant

## 2021-07-15 ENCOUNTER — Other Ambulatory Visit: Payer: Self-pay

## 2021-07-15 ENCOUNTER — Encounter: Payer: Self-pay | Admitting: Physician Assistant

## 2021-07-15 VITALS — BP 138/90 | HR 68 | Temp 98.0°F | Ht 70.0 in | Wt 214.0 lb

## 2021-07-15 DIAGNOSIS — I10 Essential (primary) hypertension: Secondary | ICD-10-CM | POA: Diagnosis not present

## 2021-07-15 DIAGNOSIS — R748 Abnormal levels of other serum enzymes: Secondary | ICD-10-CM | POA: Diagnosis not present

## 2021-07-15 DIAGNOSIS — Z23 Encounter for immunization: Secondary | ICD-10-CM

## 2021-07-15 DIAGNOSIS — E7800 Pure hypercholesterolemia, unspecified: Secondary | ICD-10-CM | POA: Diagnosis not present

## 2021-07-15 NOTE — Assessment & Plan Note (Addendum)
-  BP elevated on intake, BP repeated and systolic improved but diastolic remains elevated. Discussed to check BP at home daily for the next week and forward BP readings. If BP remains >130/80 then recommend increasing HCTZ to 50 mg. Pt verbalized understanding. ?-Recommend to stay well hydrated. ?-Recent CMP, renal function and electrolytes normal. ?-Will continue to monitor. ?

## 2021-07-15 NOTE — Assessment & Plan Note (Signed)
-  Last lipid panel: HDL 41, LDL 89 ?-Continue current medication regimen.  ?-Recommend to continue with diet changes and increased exercise. ?-Will repeat lipid panel at follow-up visit.  ?-Recent CMP, liver function has mildly improved. Will continue to monitor. ?

## 2021-07-15 NOTE — Patient Instructions (Signed)

## 2021-11-18 ENCOUNTER — Ambulatory Visit: Payer: 59 | Admitting: Physician Assistant

## 2022-06-07 NOTE — Progress Notes (Signed)
Complete physical exam   Patient: Lance Long   DOB: 04/14/1966   57 y.o. Male  MRN: [id number] Visit Date: 06/08/2022    Chief Complaint  Patient presents with   Annual Exam   Subjective    Lance Long is a 57 y.o. male who presents today for a complete physical exam.  He reports consuming an unhealthy diet. States that he eats a lot on the go. Eats fast food frequently and has a strong "sweet tooth."   Exercises daily. Walks 1(2 to 1 mile with his dog.  Does weight training. He generally feels well. He does not have additional problems to discuss today.   HPI  Annual physical  -due for routine, fasting labs -blood pressure slightly elevated at start of visit. He states that is normal for him.  - He denies chest pain, chest pressure, or shortness of breath. He denies headaches or visual disturbances. He denies abdominal pain, nausea, vomiting, or changes in bowel or bladder habits.   -concern for 7 pound weight gain since his last visit.   Past Medical History:  Diagnosis Date   Folliculitis barbae    Hypertension    Migraine    OSA (obstructive sleep apnea)    Sleep apnea    does not use c-pap machine   Past Surgical History:  Procedure Laterality Date   FOOT SURGERY Left 11.22.13   TMJ JAW  08/1998   Social History   Socioeconomic History   Marital status: Single    Spouse name: Not on file   Number of children: Not on file   Years of education: Not on file   Highest education level: Not on file  Occupational History   Not on file  Tobacco Use   Smoking status: Never   Smokeless tobacco: Never  Vaping Use   Vaping Use: Never used  Substance and Sexual Activity   Alcohol use: Yes    Alcohol/week: 2.0 standard drinks of alcohol    Types: 2 Glasses of wine per week    Comment: occasional   Drug use: No   Sexual activity: Not Currently  Other Topics Concern   Not on file  Social History Narrative   Not on file   Social Determinants of Health    Financial Resource Strain: Not on file  Food Insecurity: Not on file  Transportation Needs: Not on file  Physical Activity: Not on file  Stress: Not on file  Social Connections: Not on file  Intimate Partner Violence: Not on file   Family Status  Relation Name Status   Mother  Alive   Father  Deceased   MGM  Deceased   MGF  Deceased   PGM  Deceased   PGF  Deceased   Sister  Alive   Brother  Alive   Neg Hx  (Not Specified)   Family History  Problem Relation Age of Onset   Hypertension Mother    Fibromyalgia Mother    Diabetes Mother    Liver cancer Father    Heart attack Maternal Grandmother    Diabetes Maternal Grandmother    Hypertension Maternal Grandmother    Stroke Maternal Grandmother    Liver disease Maternal Grandfather    Healthy Sister    Healthy Brother    Colon cancer Neg Hx    Colon polyps Neg Hx    Esophageal cancer Neg Hx    Rectal cancer Neg Hx    Stomach cancer Neg Hx    No  Known Allergies  Patient Care Team: Lorrene Reid, PA-C (Inactive) as PCP - General (Physician Assistant) Lavonna Monarch, MD (Inactive) as Consulting Physician (Dermatology)   Medications: Outpatient Medications Prior to Visit  Medication Sig   atorvastatin (LIPITOR) 20 MG tablet TAKE 1 TABLET BY MOUTH TWICE WEEKLY   ibuprofen (ADVIL) 800 MG tablet Take 1 tablet (800 mg total) by mouth every 8 (eight) hours as needed.   Multiple Vitamin (MULTI VITAMIN DAILY) TABS Take by mouth daily.   sildenafil (VIAGRA) 50 MG tablet TAKE 1/2 TO 1 TABLET 30MINS TO 4 HOURS PRIOR TO SEXUAL ACTIVITY   triamcinolone cream (KENALOG) 0.1 % APPLY TO KNEE AND ELBOWS ONCE TO TWICE DAILY (BEST TIME AFTER BATH)   [DISCONTINUED] hydrochlorothiazide (HYDRODIURIL) 25 MG tablet TAKE 1 TABLET BY MOUTH EVERY DAY   No facility-administered medications prior to visit.    Review of Systems See HPI      Objective     Today's Vitals   06/08/22 0920 06/08/22 1013  BP: (Abnormal) 157/96 (Abnormal)  159/100  Pulse: 72   SpO2: 98%   Weight: 221 lb 6.4 oz (100.4 kg)   Height: 5\' 10"  (1.778 m)    Body mass index is 31.77 kg/m.  BP Readings from Last 3 Encounters:  06/08/22 (Abnormal) 159/100  07/19/21 138/90  04/07/21 132/74    Wt Readings from Last 3 Encounters:  06/08/22 221 lb 6.4 oz (100.4 kg)  07/15/21 214 lb (97.1 kg)  04/07/21 213 lb 12.8 oz (97 kg)     Physical Exam Vitals and nursing note reviewed.  Constitutional:      Appearance: Normal appearance. He is well-developed.  HENT:     Head: Normocephalic and atraumatic.     Nose: Nose normal.     Mouth/Throat:     Mouth: Mucous membranes are moist.     Pharynx: Oropharynx is clear.  Eyes:     Extraocular Movements: Extraocular movements intact.     Conjunctiva/sclera: Conjunctivae normal.     Pupils: Pupils are equal, round, and reactive to light.  Neck:     Vascular: No carotid bruit.  Cardiovascular:     Rate and Rhythm: Normal rate and regular rhythm.     Pulses: Normal pulses.     Heart sounds: Normal heart sounds.  Pulmonary:     Effort: Pulmonary effort is normal.     Breath sounds: Normal breath sounds.  Abdominal:     General: Bowel sounds are normal. There is no distension.     Palpations: Abdomen is soft. There is no mass.     Tenderness: There is no abdominal tenderness. There is no right CVA tenderness, left CVA tenderness, guarding or rebound.     Hernia: No hernia is present.  Musculoskeletal:        General: Normal range of motion.     Cervical back: Normal range of motion and neck supple.  Lymphadenopathy:     Cervical: No cervical adenopathy.  Skin:    General: Skin is warm and dry.     Capillary Refill: Capillary refill takes less than 2 seconds.  Neurological:     General: No focal deficit present.     Mental Status: He is alert and oriented to person, place, and time.  Psychiatric:        Mood and Affect: Mood normal.        Behavior: Behavior normal.        Thought Content:  Thought content normal.  Judgment: Judgment normal.    Last depression screening scores   Row Labels 06/08/2022    9:22 AM 07/15/2021   10:29 AM 04/07/2021    9:55 AM  PHQ 2/9 Scores   Section Header. No data exists in this row.     PHQ - 2 Score   1 0 1  PHQ- 9 Score   4 1 3    Last fall risk screening   Row Labels 06/08/2022    9:23 AM  [place]. No data exists in this row.   Falls in the past year?   0  Number falls in past yr:   0  Injury with Fall?   0  Follow up   Falls evaluation completed    Results for orders placed or performed in visit on 06/08/22  Hemoglobin A1c  Result Value Ref Range   Hgb A1c MFr Bld 6.1 (H) 4.8 - 5.6 %   Est. average glucose Bld gHb Est-mCnc 128 mg/dL  CBC  Result Value Ref Range   WBC 5.6 3.4 - 10.8 x10E3/uL   RBC 5.47 4.14 - 5.80 x10E6/uL   Hemoglobin 15.8 13.0 - 17.7 g/dL   Hematocrit 46.4 37.5 - 51.0 %   MCV 85 79 - 97 fL   MCH 28.9 26.6 - 33.0 pg   MCHC 34.1 31.5 - 35.7 g/dL   RDW 13.0 11.6 - 15.4 %   Platelets 280 150 - 450 x10E3/uL  Comprehensive metabolic panel  Result Value Ref Range   Glucose 106 (H) 70 - 99 mg/dL   BUN 12 6 - 24 mg/dL   Creatinine, Ser 1.20 0.76 - 1.27 mg/dL   eGFR 71 >59 mL/min/1.73   BUN/Creatinine Ratio 10 9 - 20   Sodium 139 134 - 144 mmol/L   Potassium 4.7 3.5 - 5.2 mmol/L   Chloride 100 96 - 106 mmol/L   CO2 25 20 - 29 mmol/L   Calcium 9.8 8.7 - 10.2 mg/dL   Total Protein 7.8 6.0 - 8.5 g/dL   Albumin 4.6 3.8 - 4.9 g/dL   Globulin, Total 3.2 1.5 - 4.5 g/dL   Albumin/Globulin Ratio 1.4 1.2 - 2.2   Bilirubin Total 0.4 0.0 - 1.2 mg/dL   Alkaline Phosphatase 80 44 - 121 IU/L   AST 32 0 - 40 IU/L   ALT 27 0 - 44 IU/L  Lipid panel  Result Value Ref Range   Cholesterol, Total 171 100 - 199 mg/dL   Triglycerides 79 0 - 149 mg/dL   HDL 45 >39 mg/dL   VLDL Cholesterol Cal 15 5 - 40 mg/dL   LDL Chol Calc (NIH) 111 (H) 0 - 99 mg/dL   Chol/HDL Ratio 3.8 0.0 - 5.0 ratio  PSA   Result Value Ref Range   Prostate Specific Ag, Serum 0.5 0.0 - 4.0 ng/mL  TSH + free T4  Result Value Ref Range   TSH 0.711 0.450 - 4.500 uIU/mL   Free T4 1.28 0.82 - 1.77 ng/dL    Assessment & Plan    1. Encounter for general adult medical examination with abnormal findings Annual physical today   2. Benign essential HTN Stable. Continue with HCTZ as prescribed.  - Lipid panel; Future - Comprehensive metabolic panel; Future - CBC; Future - Hemoglobin A1c; Future - Hemoglobin A1c - CBC - Comprehensive metabolic panel - Lipid panel  3. Elevated LDL cholesterol level Check fasting lipids today and adjust lipitor as prescribed.  - Lipid panel;  Future - Comprehensive metabolic panel; Future - CBC; Future - Hemoglobin A1c; Future - Hemoglobin A1c - CBC - Comprehensive metabolic panel - Lipid panel  4. Abnormal weight gain Check thyroid panel for further evaluation.  - TSH + free T4; Future - TSH + free T4  5. Screening for prostate cancer Check PSA - PSA; Future - PSA  6. Need for influenza vaccination Flu vaccine administered during today's visit.  - Flu Vaccine QUAD 6+ mos PF IM (Fluarix Quad PF)    Immunization History  Administered Date(s) Administered   Influenza,inj,Quad PF,6+ Mos 02/26/2018, 03/07/2019, 04/07/2020, 04/07/2021, 06/08/2022   PFIZER(Purple Top)SARS-COV-2 Vaccination 12/08/2019, 12/29/2019   Tdap 08/15/2007, 02/26/2018   Zoster Recombinat (Shingrix) 04/07/2021, 07/15/2021    Health Maintenance  Topic Date Due   COVID-19 Vaccine (3 - Pfizer risk series) 01/26/2020   COLONOSCOPY (Pts 45-51yrs Insurance coverage will need to be confirmed)  02/10/2028   DTaP/Tdap/Td (3 - Td or Tdap) 02/27/2028   INFLUENZA VACCINE  Completed   Hepatitis C Screening  Completed   HIV Screening  Completed   Zoster Vaccines- Shingrix  Completed   HPV VACCINES  Aged Out    Discussed health benefits of physical activity, and encouraged him to engage in  regular exercise appropriate for his age and condition.  Problem List Items Addressed This Visit       Cardiovascular and Mediastinum   Benign essential HTN   Relevant Orders   Lipid panel (Completed)   Comprehensive metabolic panel (Completed)   CBC (Completed)   Hemoglobin A1c (Completed)     Other   Elevated LDL cholesterol level   Relevant Orders   Lipid panel (Completed)   Comprehensive metabolic panel (Completed)   CBC (Completed)   Hemoglobin A1c (Completed)   Abnormal weight gain   Relevant Orders   TSH + free T4 (Completed)   Other Visit Diagnoses     Encounter for general adult medical examination with abnormal findings    -  Primary   Screening for prostate cancer       Relevant Orders   PSA (Completed)   Need for influenza vaccination       Relevant Orders   Flu Vaccine QUAD 6+ mos PF IM (Fluarix Quad PF) (Completed)        No follow-ups on file.        Lance Freshwater, NP  [facility] [phone] (phone) [phone] (fax)  [place]

## 2022-06-08 ENCOUNTER — Ambulatory Visit (INDEPENDENT_AMBULATORY_CARE_PROVIDER_SITE_OTHER): Payer: 59 | Admitting: Nurse Practitioner

## 2022-06-08 ENCOUNTER — Other Ambulatory Visit: Payer: Self-pay

## 2022-06-08 ENCOUNTER — Encounter: Payer: Self-pay | Admitting: Nurse Practitioner

## 2022-06-08 VITALS — BP 159/100 | HR 72 | Ht 70.0 in | Wt 221.4 lb

## 2022-06-08 DIAGNOSIS — Z125 Encounter for screening for malignant neoplasm of prostate: Secondary | ICD-10-CM

## 2022-06-08 DIAGNOSIS — I10 Essential (primary) hypertension: Secondary | ICD-10-CM

## 2022-06-08 DIAGNOSIS — R635 Abnormal weight gain: Secondary | ICD-10-CM

## 2022-06-08 DIAGNOSIS — Z0001 Encounter for general adult medical examination with abnormal findings: Secondary | ICD-10-CM | POA: Diagnosis not present

## 2022-06-08 DIAGNOSIS — Z23 Encounter for immunization: Secondary | ICD-10-CM

## 2022-06-08 DIAGNOSIS — E7800 Pure hypercholesterolemia, unspecified: Secondary | ICD-10-CM | POA: Diagnosis not present

## 2022-06-08 MED ORDER — HYDROCHLOROTHIAZIDE 25 MG PO TABS: 25.0000 mg | ORAL_TABLET | Freq: Every day | ORAL | 1 refills | Status: DC

## 2022-06-09 LAB — LIPID PANEL
Chol/HDL Ratio: 3.8 ratio (ref 0.0–5.0)
Cholesterol, Total: 171 mg/dL (ref 100–199)
HDL: 45 mg/dL (ref >39)
LDL Chol Calc (NIH): 111 mg/dL — ABNORMAL HIGH (ref 0–99)
Triglycerides: 79 mg/dL (ref 0–149)
VLDL Cholesterol Cal: 15 mg/dL (ref 5–40)

## 2022-06-09 LAB — CBC
Hematocrit: 46.4 % (ref 37.5–51.0)
Hemoglobin: 15.8 g/dL (ref 13.0–17.7)
MCH: 28.9 pg (ref 26.6–33.0)
MCHC: 34.1 g/dL (ref 31.5–35.7)
MCV: 85 fL (ref 79–97)
Platelets: 280 10*3/uL (ref 150–450)
RBC: 5.47 x10E6/uL (ref 4.14–5.80)
RDW: 13 % (ref 11.6–15.4)
WBC: 5.6 10*3/uL (ref 3.4–10.8)

## 2022-06-09 LAB — COMPREHENSIVE METABOLIC PANEL
ALT: 27 IU/L (ref 0–44)
AST: 32 IU/L (ref 0–40)
Albumin/Globulin Ratio: 1.4 (ref 1.2–2.2)
Albumin: 4.6 g/dL (ref 3.8–4.9)
Alkaline Phosphatase: 80 IU/L (ref 44–121)
BUN/Creatinine Ratio: 10 (ref 9–20)
BUN: 12 mg/dL (ref 6–24)
Bilirubin Total: 0.4 mg/dL (ref 0.0–1.2)
CO2: 25 mmol/L (ref 20–29)
Calcium: 9.8 mg/dL (ref 8.7–10.2)
Chloride: 100 mmol/L (ref 96–106)
Creatinine, Ser: 1.2 mg/dL (ref 0.76–1.27)
Globulin, Total: 3.2 g/dL (ref 1.5–4.5)
Glucose: 106 mg/dL — ABNORMAL HIGH (ref 70–99)
Potassium: 4.7 mmol/L (ref 3.5–5.2)
Sodium: 139 mmol/L (ref 134–144)
Total Protein: 7.8 g/dL (ref 6.0–8.5)
eGFR: 71 mL/min/{1.73_m2} (ref >59)

## 2022-06-09 LAB — PSA: Prostate Specific Ag, Serum: 0.5 ng/mL (ref 0.0–4.0)

## 2022-06-09 LAB — HEMOGLOBIN A1C
Est. average glucose Bld gHb Est-mCnc: 128 mg/dL
Hgb A1c MFr Bld: 6.1 % — ABNORMAL HIGH (ref 4.8–5.6)

## 2022-06-09 LAB — TSH+FREE T4
Free T4: 1.28 ng/dL (ref 0.82–1.77)
TSH: 0.711 u[IU]/mL (ref 0.450–4.500)

## 2022-07-17 DIAGNOSIS — R635 Abnormal weight gain: Secondary | ICD-10-CM | POA: Insufficient documentation

## 2022-12-06 ENCOUNTER — Ambulatory Visit (INDEPENDENT_AMBULATORY_CARE_PROVIDER_SITE_OTHER): Payer: 59 | Admitting: Family Medicine

## 2022-12-06 ENCOUNTER — Encounter: Payer: Self-pay | Admitting: Family Medicine

## 2022-12-06 VITALS — BP 124/82 | HR 69 | Ht 70.0 in | Wt 206.8 lb

## 2022-12-06 DIAGNOSIS — I10 Essential (primary) hypertension: Secondary | ICD-10-CM

## 2022-12-06 DIAGNOSIS — W57XXXA Bitten or stung by nonvenomous insect and other nonvenomous arthropods, initial encounter: Secondary | ICD-10-CM

## 2022-12-06 DIAGNOSIS — R21 Rash and other nonspecific skin eruption: Secondary | ICD-10-CM

## 2022-12-06 DIAGNOSIS — E7800 Pure hypercholesterolemia, unspecified: Secondary | ICD-10-CM | POA: Diagnosis not present

## 2022-12-06 MED ORDER — TRIAMCINOLONE ACETONIDE 0.1 % EX CREA: TOPICAL_CREAM | CUTANEOUS | 1 refills | Status: AC

## 2022-12-06 NOTE — Progress Notes (Unsigned)
   Acute Office Visit  Subjective:     Patient ID: Lance Long, male    DOB: [birth year], 57 y.o.   MRN: [id number]  Chief Complaint  Patient presents with   Insect Bite    HPI Patient is in today for evaluation of tick bite on his abdomen.  Patient states he was bitten by a tick in May.  He is a Administrator.  Since that time he has had localized itching.  No other symptoms.  No fevers, chills, swelling, knee pain or joint pain, headaches.  No other rashes other than the one at the site of the bite.  Has been putting a hydrocortisone over-the-counter anti-itch cream on as needed.  Usually itches about 3 days a week currently.  Patient has been more conscious about checking his blood pressure and trying to lower it.  He is still taking his hydrochlorothiazide.  HLD-patient has not taken his cholesterol medication in several months.  Says he is eating healthy, exercising regularly.  We discussed modifiable and nonmodifiable risk factors for ASCVD.  He would like to wait until our next visit in 2 months to recheck his cholesterol level.  ROS      Objective:    BP 124/82   Pulse 69   Ht 5\' 10"  (1.778 m)   Wt 206 lb 12.8 oz (93.8 kg)   SpO2 99%   BMI 29.67 kg/m  {Vitals History (Optional):23777}  Physical Exam General: Alert, oriented Pulmonary: No respiratory distress Skin: Small hypopigmented papule on the abdomen in the lower left quadrant.  No drainage.  No surrounding erythema.  Nontender.  No results found for any visits on 12/06/22.      Assessment & Plan:   Tick bite, unspecified site, initial encounter Assessment & Plan: Small papule seen on exam at the site of the tick bite.  Is otherwise asymptomatic besides pruritus.  Appears to be a localized hypersensitivity reaction to the bite.  Unlikely to be related to tickborne illness given lack of other symptoms.  Advised patient to use triamcinolone twice a day for 2 weeks.  If still itching afterwards we will get CBC  with differential to look at eosinophils and order additional tickborne illness labs.  Also advised to use over-the-counter anti-itch creams that do not contain hydrocortisone or other steroids.   Benign essential HTN Assessment & Plan: Blood pressure good today.  Exercising regularly.  Compliant with his HCTZ.  Continue current management.   Elevated LDL cholesterol level Assessment & Plan: Has not been taking his statin.  He is exercising regularly and eating healthy.  He is a non-smoker.  Patient wishes to defer lipid panel until next visit.  Will follow-up in 2 months for lipid panel.  Patient declines statin refilled at this time.   Rash -     Triamcinolone Acetonide; Apply to affected area twice a day for 2 weeks  Dispense: 15 g; Refill: 1     Return in about 2 months (around 02/05/2023) for hld.  Sandre Kitty, MD

## 2022-12-06 NOTE — Assessment & Plan Note (Signed)
Blood pressure good today.  Exercising regularly.  Compliant with his HCTZ.  Continue current management.

## 2022-12-06 NOTE — Assessment & Plan Note (Signed)
Has not been taking his statin.  He is exercising regularly and eating healthy.  He is a non-smoker.  Patient wishes to defer lipid panel until next visit.  Will follow-up in 2 months for lipid panel.  Patient declines statin refilled at this time.

## 2022-12-06 NOTE — Assessment & Plan Note (Signed)
Small papule seen on exam at the site of the tick bite.  Is otherwise asymptomatic besides pruritus.  Appears to be a localized hypersensitivity reaction to the bite.  Unlikely to be related to tickborne illness given lack of other symptoms.  Advised patient to use triamcinolone twice a day for 2 weeks.  If still itching afterwards we will get CBC with differential to look at eosinophils and order additional tickborne illness labs.  Also advised to use over-the-counter anti-itch creams that do not contain hydrocortisone or other steroids.

## 2022-12-06 NOTE — Patient Instructions (Signed)
It was nice to see you today,  We addressed the following topics today: - use the steroid cream I provided twice a day for the next two weeks - you can also use over the counter anti itch creams as needed.  Use the ones that don't contain hydrocortisone.   - if it does not improve after two week let us know.    Have a great day,  Frederic Jericho, MD

## 2023-02-09 ENCOUNTER — Ambulatory Visit: Payer: 59 | Admitting: Family Medicine

## 2023-02-09 NOTE — Progress Notes (Deleted)
   Established Patient Office Visit  Subjective   Patient ID: Lance Long, male    DOB: 1965/08/25  Age: 57 y.o. MRN: [id number]  No chief complaint on file.   HPI  HLD-he stopped taking his statin.  Hypertension-taking HCTZ?   The 10-year ASCVD risk score (Arnett DK, et al., 2019) is: 10.6%  Health Maintenance Due  Topic Date Due   COVID-19 Vaccine (3 - Pfizer risk series) 01/26/2020   INFLUENZA VACCINE  12/01/2022      Objective:     There were no vitals taken for this visit. {Vitals History (Optional):23777}  Physical Exam   No results found for any visits on 02/09/23.      Assessment & Plan:   There are no diagnoses linked to this encounter.   No follow-ups on file.    Sandre Kitty, MD

## 2023-04-10 ENCOUNTER — Other Ambulatory Visit: Payer: Self-pay | Admitting: Nurse Practitioner

## 2023-04-10 DIAGNOSIS — I10 Essential (primary) hypertension: Secondary | ICD-10-CM

## 2023-04-10 NOTE — Telephone Encounter (Signed)
Hi Dr. Corky Downs. This patient has you listed as PCP.

## 2023-06-02 ENCOUNTER — Other Ambulatory Visit: Payer: 59

## 2023-06-02 DIAGNOSIS — E7800 Pure hypercholesterolemia, unspecified: Secondary | ICD-10-CM

## 2023-06-03 LAB — LIPID PANEL
Chol/HDL Ratio: 4.2 {ratio} (ref 0.0–5.0)
Cholesterol, Total: 168 mg/dL (ref 100–199)
HDL: 40 mg/dL (ref >39)
LDL Chol Calc (NIH): 110 mg/dL — ABNORMAL HIGH (ref 0–99)
Triglycerides: 98 mg/dL (ref 0–149)
VLDL Cholesterol Cal: 18 mg/dL (ref 5–40)

## 2023-06-09 ENCOUNTER — Encounter: Payer: Self-pay | Admitting: Family Medicine

## 2023-06-09 ENCOUNTER — Ambulatory Visit (INDEPENDENT_AMBULATORY_CARE_PROVIDER_SITE_OTHER): Payer: 59 | Admitting: Family Medicine

## 2023-06-09 VITALS — BP 121/84 | HR 73 | Ht 70.0 in | Wt 221.8 lb

## 2023-06-09 DIAGNOSIS — I10 Essential (primary) hypertension: Secondary | ICD-10-CM

## 2023-06-09 DIAGNOSIS — E7800 Pure hypercholesterolemia, unspecified: Secondary | ICD-10-CM

## 2023-06-09 DIAGNOSIS — Z0000 Encounter for general adult medical examination without abnormal findings: Secondary | ICD-10-CM | POA: Diagnosis not present

## 2023-06-09 DIAGNOSIS — E669 Obesity, unspecified: Secondary | ICD-10-CM

## 2023-06-09 DIAGNOSIS — N529 Male erectile dysfunction, unspecified: Secondary | ICD-10-CM

## 2023-06-09 DIAGNOSIS — Z125 Encounter for screening for malignant neoplasm of prostate: Secondary | ICD-10-CM

## 2023-06-09 LAB — POCT GLYCOSYLATED HEMOGLOBIN (HGB A1C): HbA1c POC (<> result, manual entry): 5.8 % (ref 4.0–5.6)

## 2023-06-09 MED ORDER — TADALAFIL 5 MG PO TABS: 5.0000 mg | ORAL_TABLET | Freq: Every day | ORAL | 11 refills | Status: AC

## 2023-06-09 NOTE — Progress Notes (Signed)
 Annual physical  Subjective   Patient ID: Lance Long, male    DOB: [birth year]  Age: 58 y.o. MRN: [id number]  Chief Complaint  Patient presents with   Annual Exam   HPI Lance Long is a 59 y.o. old male here  for annual exam.   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  He is currently in a relationship.  Sexually active.  Does not use tobacco.  Only uses alcohol occasionally on the weekends, not in excess.  No recreational drug use.  Recently started exercising again.  Eats a regular diet, but has admitted to eating more sweets recently.  Patient only concern is that since December he has had worsening issues with erectile dysfunction.  Had an issue with this in 2019 but states this was around the same time his wife passed away.  Has tried any over-the-counter treatment but this was not effective.  Not getting morning erections.  He is able to sometimes get erections with intercourse but will be unable to finish.  As far as he knows he does not have any unusual curvature or bend to his erections.  Only medication is HCTZ for blood pressure.  No family history of prostate or colon cancer.   The 10-year ASCVD risk score (Arnett DK, et al., 2019) is: 10.9%  Health Maintenance Due  Topic Date Due   COVID-19 Vaccine (3 - Pfizer risk series) 01/26/2020   INFLUENZA VACCINE  12/01/2022      Objective:     BP 121/84   Pulse 73   Ht 5' 10 (1.778 m)   Wt 221 lb 12.8 oz (100.6 kg)   SpO2 99%   BMI 31.82 kg/m    Physical Exam General: Alert, oriented HEENT: PERRLA, EOMI, moist mucosa CV: Rate rate rhythm Pulmonary: Lungs clear bilaterally GI: Soft, normal bowel sounds MSK: Strength equal bilaterally Extremities: No pedal edema Psych: Pleasant affect.   Results for orders placed or performed in visit on 06/09/23  POCT HgB A1C  Result Value Ref Range   Hemoglobin A1C      HbA1c POC (<> result, manual entry) 5.8 4.0 - 5.6 %   HbA1c, POC (prediabetic range)     HbA1c, POC (controlled diabetic range)          Assessment & Plan:   Physical exam, annual  Erectile dysfunction, unspecified erectile dysfunction type Assessment & Plan: 1 month of worsening of symptoms.  Is able to occasionally get erection with intercourse but will often lose the erection prior to orgasm.  No morning erections.  Discussed sildenafil  versus daily tadalafil .  Will try for daily tadalafil  5 mg first, but if insurance does not cover it or if it is cost prohibitive we will switch to sildenafil  or as needed tadalafil .  Will check testosterone  level as well.  Orders: -     Testosterone   Elevated LDL cholesterol level Assessment & Plan: Mildly elevated, but given the patient's complaints of erectile dysfunction we will get ApoB and LPA.  Consider coronary artery calcium  scoring.  Patient had stopped taking his statin prior to previous visit and is still not on it.  Orders: -     Apolipoprotein B -     Lipoprotein A (LPA)  Obesity (BMI 30-39.9) -     POCT glycosylated hemoglobin (Hb A1C)  Benign essential HTN Assessment & Plan: Blood pressure at goal today.  Continue  hydrochlorothiazide .  Orders: -     Comprehensive metabolic panel  Prostate cancer screening -     PSA  Other orders -     Tadalafil ; Take 1 tablet (5 mg total) by mouth daily.  Dispense: 30 tablet; Refill: 11     Return in about 4 weeks (around 07/07/2023) for hld.    Lance MARLA Slain, MD

## 2023-06-09 NOTE — Patient Instructions (Signed)
 It was nice to see you today,  We addressed the following topics today: -I have sent in a medication for tadalafil  for you to take daily.  If there are any issues with your insurance covering this medication please let me know and we can find an alternative. - I have ordered some more test to check for hyperlipidemia as this is the leading cause of ED.  I have also tested for testosterone . - I will let you know the results when I get them.  I will follow back up with you in 1 month.  Have a great day,  Rolan Slain, MD

## 2023-06-09 NOTE — Assessment & Plan Note (Signed)
 Blood pressure at goal today.  Continue hydrochlorothiazide .

## 2023-06-09 NOTE — Assessment & Plan Note (Addendum)
 1 month of worsening of symptoms.  Is able to occasionally get erection with intercourse but will often lose the erection prior to orgasm.  No morning erections.  Discussed sildenafil  versus daily tadalafil .  Will try for daily tadalafil  5 mg first, but if insurance does not cover it or if it is cost prohibitive we will switch to sildenafil  or as needed tadalafil .  Will check testosterone  level as well.

## 2023-06-09 NOTE — Assessment & Plan Note (Signed)
 Mildly elevated, but given the patient's complaints of erectile dysfunction we will get ApoB and LPA.  Consider coronary artery calcium  scoring.  Patient had stopped taking his statin prior to previous visit and is still not on it.

## 2023-06-12 ENCOUNTER — Other Ambulatory Visit: Payer: Self-pay | Admitting: Family Medicine

## 2023-06-12 ENCOUNTER — Encounter: Payer: Self-pay | Admitting: Family Medicine

## 2023-06-12 DIAGNOSIS — R748 Abnormal levels of other serum enzymes: Secondary | ICD-10-CM

## 2023-06-12 DIAGNOSIS — R7989 Other specified abnormal findings of blood chemistry: Secondary | ICD-10-CM

## 2023-06-12 LAB — COMPREHENSIVE METABOLIC PANEL
ALT: 38 [IU]/L (ref 0–44)
AST: 46 [IU]/L — ABNORMAL HIGH (ref 0–40)
Albumin: 4.4 g/dL (ref 3.8–4.9)
Alkaline Phosphatase: 84 [IU]/L (ref 44–121)
BUN/Creatinine Ratio: 8 — ABNORMAL LOW (ref 9–20)
BUN: 11 mg/dL (ref 6–24)
Bilirubin Total: 0.7 mg/dL (ref 0.0–1.2)
CO2: 25 mmol/L (ref 20–29)
Calcium: 9.7 mg/dL (ref 8.7–10.2)
Chloride: 99 mmol/L (ref 96–106)
Creatinine, Ser: 1.34 mg/dL — ABNORMAL HIGH (ref 0.76–1.27)
Globulin, Total: 3.2 g/dL (ref 1.5–4.5)
Glucose: 100 mg/dL — ABNORMAL HIGH (ref 70–99)
Potassium: 4.2 mmol/L (ref 3.5–5.2)
Sodium: 140 mmol/L (ref 134–144)
Total Protein: 7.6 g/dL (ref 6.0–8.5)
eGFR: 62 mL/min/{1.73_m2} (ref >59)

## 2023-06-12 LAB — TESTOSTERONE: Testosterone: 261 ng/dL — ABNORMAL LOW (ref 264–916)

## 2023-06-12 LAB — APOLIPOPROTEIN B: Apolipoprotein B: 83 mg/dL (ref <90)

## 2023-06-12 LAB — LIPOPROTEIN A (LPA): Lipoprotein (a): 57.8 nmol/L (ref <75.0)

## 2023-06-12 LAB — PSA: Prostate Specific Ag, Serum: 0.5 ng/mL (ref 0.0–4.0)

## 2023-06-15 ENCOUNTER — Ambulatory Visit
Admission: RE | Admit: 2023-06-15 | Discharge: 2023-06-15 | Disposition: A | Discharge status: Home / Self Care | Payer: 59 | Source: Ambulatory Visit | Attending: Family Medicine | Admitting: Family Medicine

## 2023-06-15 DIAGNOSIS — R748 Abnormal levels of other serum enzymes: Secondary | ICD-10-CM

## 2023-06-19 ENCOUNTER — Other Ambulatory Visit: Payer: Self-pay | Admitting: Family Medicine

## 2023-06-19 ENCOUNTER — Encounter: Payer: Self-pay | Admitting: Family Medicine

## 2023-06-19 DIAGNOSIS — K760 Fatty (change of) liver, not elsewhere classified: Secondary | ICD-10-CM

## 2023-06-23 ENCOUNTER — Ambulatory Visit
Admission: RE | Admit: 2023-06-23 | Discharge: 2023-06-23 | Disposition: A | Discharge status: Home / Self Care | Payer: 59 | Source: Ambulatory Visit | Attending: Internal Medicine | Admitting: Internal Medicine

## 2023-06-23 ENCOUNTER — Ambulatory Visit (INDEPENDENT_AMBULATORY_CARE_PROVIDER_SITE_OTHER): Payer: 59

## 2023-06-23 VITALS — BP 167/106 | HR 71 | Temp 98.4°F | Resp 18 | Ht 70.0 in | Wt 221.8 lb

## 2023-06-23 DIAGNOSIS — M25522 Pain in left elbow: Secondary | ICD-10-CM

## 2023-06-23 DIAGNOSIS — M25512 Pain in left shoulder: Secondary | ICD-10-CM | POA: Diagnosis not present

## 2023-06-23 MED ORDER — KETOROLAC TROMETHAMINE 30 MG/ML IJ SOLN
30.0000 mg | Freq: Once | INTRAMUSCULAR | Status: AC
  Administered 2023-06-23: 30 mg via INTRAMUSCULAR

## 2023-06-23 MED ORDER — PREDNISONE 20 MG PO TABS: 40.0000 mg | ORAL_TABLET | Freq: Every day | ORAL | 0 refills | Status: AC

## 2023-06-23 MED ORDER — DEXAMETHASONE SODIUM PHOSPHATE 10 MG/ML IJ SOLN
10.0000 mg | Freq: Once | INTRAMUSCULAR | Status: AC
  Administered 2023-06-23: 10 mg via INTRAMUSCULAR

## 2023-06-23 MED ORDER — CYCLOBENZAPRINE HCL 5 MG PO TABS: 5.0000 mg | ORAL_TABLET | Freq: Three times a day (TID) | ORAL | 0 refills | Status: AC | PRN

## 2023-06-23 NOTE — ED Provider Notes (Signed)
 [facility]    CSN: [id number] Arrival date & time: 06/23/23  1419      History   Chief Complaint Chief Complaint  Patient presents with   Joint Pain    Pain in elbow and upper arm area. - Entered by patient    HPI Lance Long is a 58 y.o. male.   58 year old male who presents urgent care with complaints of left elbow, left shoulder and back pain.  This is all isolated on the left side.  He reports that about 2 weeks ago he was working out in the next day felt like his elbow was very painful.  It then progressed up to his shoulder and now has started to be along the anterior aspect of the shoulder as well as into the left side of the back.  This originally started around February 12.  He denies any injury to the area, specifically denying any fall or previous injury.  He denies any shortness of breath or history of cardiac condition.  He does have hypertension.  And his blood pressure is elevated today but he did take his blood pressure medicine today.  It normally is not running very high.  He reports that he is in a lot of pain.  He is having trouble sleeping at night as he cannot get comfortable.     Past Medical History:  Diagnosis Date   Folliculitis barbae    Hypertension    Migraine    OSA (obstructive sleep apnea)    Sleep apnea    does not use c-pap machine    Patient Active Problem List   Diagnosis Date Noted   Tick bite 12/06/2022   Abnormal weight gain 07/17/2022   Peripheral neuralgia 04/07/2020   Allergic rhinitis 04/07/2020   Elevated LDL cholesterol level 03/07/2019   Acute pain of right knee 11/14/2018   Leg numbness 06/13/2018   Leg weakness, bilateral 06/13/2018   Erectile dysfunction 02/26/2018   Chronic migraine 02/06/2018   Migraine 12/19/2017   Healthcare maintenance 12/19/2017   OSA (obstructive sleep apnea) 10/29/2014   Benign essential HTN 10/29/2014   Obesity (BMI 30-39.9) 10/29/2014   Closed fracture of metatarsal bone  02/12/2013    Past Surgical History:  Procedure Laterality Date   FOOT SURGERY Left 11.22.13   TMJ JAW  08/1998       Home Medications    Prior to Admission medications   Medication Sig Start Date End Date Taking? Authorizing Provider  atorvastatin (LIPITOR) 20 MG tablet TAKE 1 TABLET BY MOUTH TWICE WEEKLY 04/29/21   Abonza, Maritza, PA-C  hydrochlorothiazide (HYDRODIURIL) 25 MG tablet TAKE 1 TABLET (25 MG TOTAL) BY MOUTH DAILY. 04/10/23   Sandre Kitty, MD  ibuprofen (ADVIL) 800 MG tablet Take 1 tablet (800 mg total) by mouth every 8 (eight) hours as needed. 12/19/18   Tarry Kos, MD  Multiple Vitamin (MULTI VITAMIN DAILY) TABS Take by mouth daily.    [provider]  tadalafil (CIALIS) 5 MG tablet Take 1 tablet (5 mg total) by mouth daily. 06/09/23   Sandre Kitty, MD  triamcinolone cream (KENALOG) 0.1 % Apply to affected area twice a day for 2 weeks 12/06/22   Sandre Kitty, MD    Family History Family History  Problem Relation Age of Onset   Hypertension Mother    Fibromyalgia Mother    Diabetes Mother    Liver cancer Father    Heart attack Maternal Grandmother    Diabetes  Maternal Grandmother    Hypertension Maternal Grandmother    Stroke Maternal Grandmother    Liver disease Maternal Grandfather    Healthy Sister    Healthy Brother    Colon cancer Neg Hx    Colon polyps Neg Hx    Esophageal cancer Neg Hx    Rectal cancer Neg Hx    Stomach cancer Neg Hx     Social History Social History   Tobacco Use   Smoking status: Never   Smokeless tobacco: Never  Vaping Use   Vaping status: Never Used  Substance Use Topics   Alcohol use: Yes    Alcohol/week: 2.0 standard drinks of alcohol    Types: 2 Glasses of wine per week    Comment: occasional   Drug use: No     Allergies   Patient has no known allergies.   Review of Systems Review of Systems  Constitutional:  Negative for chills and fever.  HENT:  Negative for ear pain and sore throat.    Eyes:  Negative for pain and visual disturbance.  Respiratory:  Negative for cough and shortness of breath.   Cardiovascular:  Negative for chest pain and palpitations.  Gastrointestinal:  Negative for abdominal pain and vomiting.  Genitourinary:  Negative for dysuria and hematuria.  Musculoskeletal:  Positive for back pain and joint swelling (Left elbow, left shoulder). Negative for arthralgias.  Skin:  Negative for color change and rash.  Neurological:  Negative for seizures and syncope.  All other systems reviewed and are negative.    Physical Exam Triage Vital Signs ED Triage Vitals  Encounter Vitals Group     BP 06/23/23 1517 (!) 167/106     Systolic BP Percentile --      Diastolic BP Percentile --      Pulse Rate 06/23/23 1517 71     Resp 06/23/23 1517 18     Temp 06/23/23 1517 98.4 F (36.9 C)     Temp Source 06/23/23 1517 Oral     SpO2 06/23/23 1517 96 %     Weight 06/23/23 1515 221 lb 12.5 oz (100.6 kg)     Height 06/23/23 1515 5\' 10"  (1.778 m)     Head Circumference --      Peak Flow --      Pain Score 06/23/23 1514 9     Pain Loc --      Pain Education --      Exclude from Growth Chart --    No data found.  Updated Vital Signs BP (!) 167/106 (BP Location: Right Arm) Comment: Pt takes meds to maintain; last taken this morning  Pulse 71   Temp 98.4 F (36.9 C) (Oral)   Resp 18   Ht 5\' 10"  (1.778 m)   Wt 221 lb 12.5 oz (100.6 kg)   SpO2 96%   BMI 31.82 kg/m   Visual Acuity Right Eye Distance:   Left Eye Distance:   Bilateral Distance:    Right Eye Near:   Left Eye Near:    Bilateral Near:     Physical Exam Vitals and nursing note reviewed.  Constitutional:      General: He is not in acute distress.    Appearance: He is well-developed.  HENT:     Head: Normocephalic and atraumatic.  Eyes:     Conjunctiva/sclera: Conjunctivae normal.  Cardiovascular:     Rate and Rhythm: Normal rate and regular rhythm.     Heart sounds: No murmur  heard. Pulmonary:  Effort: Pulmonary effort is normal. No respiratory distress.     Breath sounds: Normal breath sounds.  Abdominal:     Palpations: Abdomen is soft.     Tenderness: There is no abdominal tenderness.  Musculoskeletal:        General: No swelling.     Left shoulder: Tenderness (With abduction especially) present. No swelling, deformity or crepitus. Normal range of motion. Normal strength. Normal pulse.     Left elbow: No swelling, deformity or effusion. Normal range of motion. Tenderness (Mild with extension and flexion) present.     Cervical back: Neck supple.  Skin:    General: Skin is warm and dry.     Capillary Refill: Capillary refill takes less than 2 seconds.  Neurological:     Mental Status: He is alert.  Psychiatric:        Mood and Affect: Mood normal.      UC Treatments / Results  Labs (all labs ordered are listed, but only abnormal results are displayed) Labs Reviewed - No data to display  EKG   Radiology No results found.  Procedures Procedures (including critical care time)  Medications Ordered in UC Medications - No data to display  Initial Impression / Assessment and Plan / UC Course  I have reviewed the triage vital signs and the nursing notes.  Pertinent labs & imaging results that were available during my care of the patient were reviewed by me and considered in my medical decision making (see chart for details).     Acute pain of left shoulder - Plan: DG Shoulder Left, DG Shoulder Left  Left elbow pain - Plan: DG Elbow Complete Left, DG Elbow Complete Left   X-ray done on the left shoulder and left elbow.  Final results are still pending but on brief overview there does not appear to be any fractures or even any significant arthritis.  Symptoms are likely secondary to muscle strain but cannot completely rule out a side effect to tadalafil which was started right before the symptoms started and does have side effects consistent  with the symptoms. We will treat with the following:  Toradol injection given today. This is a medication to help with pain. This is not a narcotic.  Decadron injection given today. This is a steroid to help with inflammation and pain. Prednisone 40 mg (2 tablets) once daily for 5 days. Take this in the morning.  This is a steroid to help with inflammation and pain. Start tomorrow 06/23/23 Flexeril 5 mg every 8 hours as needed for muscle spasms.  Use caution as this medication can cause drowsiness. Light stretching.  Slowly increase activity as tolerated. If symptoms fail to resolve, then may need to consider following up with PCP. Also, speak with PCP about the side effects of Tadalafil causing these symptoms.   Blood pressure was addressed during visit and recommended monitoring this at home. Follow up with PCP for ongoing management. Go to the nearest emergency department if you develop stroke like symptoms, severe headache or severe mid sternal chest pain.   Return to urgent care or PCP if symptoms worsen.    Final Clinical Impressions(s) / UC Diagnoses   Final diagnoses:  Acute pain of left shoulder  Left elbow pain   Discharge Instructions   None    ED Prescriptions   None    PDMP not reviewed this encounter.   Landis Martins, [place] 06/23/23 1624

## 2023-06-23 NOTE — Discharge Instructions (Addendum)
 X-ray done on the left shoulder and left elbow.  Final results are still pending but on brief overview there does not appear to be any fractures or even any significant arthritis.  Symptoms are likely secondary to muscle strain but cannot completely rule out a side effect to tadalafil which was started right before the symptoms started and does have side effects consistent with the symptoms. We will treat with the following:  Toradol injection given today. This is a medication to help with pain. This is not a narcotic.  Decadron injection given today. This is a steroid to help with inflammation and pain. Prednisone 40 mg (2 tablets) once daily for 5 days. Take this in the morning.  This is a steroid to help with inflammation and pain. Start tomorrow 06/23/23 Flexeril 5 mg every 8 hours as needed for muscle spasms.  Use caution as this medication can cause drowsiness. Light stretching.  Slowly increase activity as tolerated. If symptoms fail to resolve, then may need to consider following up with PCP. Also, speak with PCP about the side effects of Tadalafil causing these symptoms.   Blood pressure was addressed during visit and recommended monitoring this at home. Follow up with PCP for ongoing management. Go to the nearest emergency department if you develop stroke like symptoms, severe headache or severe mid sternal chest pain.   Return to urgent care or PCP if symptoms worsen.

## 2023-06-23 NOTE — ED Triage Notes (Addendum)
 Pt presents with elbow pain in left elbow that radiates up his arm and down left flank. Pt states it started at just the elbow then expanded over time. Symptoms onset around 06/14/23. Pt assumed he "overworked" during a workout but pain started to increase. Pt denies any injuries or falls.

## 2023-06-28 ENCOUNTER — Other Ambulatory Visit: Payer: 59

## 2023-06-28 DIAGNOSIS — R748 Abnormal levels of other serum enzymes: Secondary | ICD-10-CM

## 2023-06-28 DIAGNOSIS — R7989 Other specified abnormal findings of blood chemistry: Secondary | ICD-10-CM

## 2023-06-28 DIAGNOSIS — K760 Fatty (change of) liver, not elsewhere classified: Secondary | ICD-10-CM

## 2023-06-29 ENCOUNTER — Other Ambulatory Visit: Payer: 59

## 2023-06-29 LAB — CBC
Hematocrit: 43.7 % (ref 37.5–51.0)
Hemoglobin: 15.1 g/dL (ref 13.0–17.7)
MCH: 30.3 pg (ref 26.6–33.0)
MCHC: 34.6 g/dL (ref 31.5–35.7)
MCV: 88 fL (ref 79–97)
Platelets: 278 10*3/uL (ref 150–450)
RBC: 4.98 x10E6/uL (ref 4.14–5.80)
RDW: 13.1 % (ref 11.6–15.4)
WBC: 10 10*3/uL (ref 3.4–10.8)

## 2023-06-29 LAB — COMPREHENSIVE METABOLIC PANEL
ALT: 25 IU/L (ref 0–44)
AST: 26 IU/L (ref 0–40)
Albumin: 4.3 g/dL (ref 3.8–4.9)
Alkaline Phosphatase: 70 IU/L (ref 44–121)
BUN/Creatinine Ratio: 10 (ref 9–20)
BUN: 13 mg/dL (ref 6–24)
Bilirubin Total: 0.3 mg/dL (ref 0.0–1.2)
CO2: 25 mmol/L (ref 20–29)
Calcium: 9.2 mg/dL (ref 8.7–10.2)
Chloride: 99 mmol/L (ref 96–106)
Creatinine, Ser: 1.25 mg/dL (ref 0.76–1.27)
Globulin, Total: 2.6 g/dL (ref 1.5–4.5)
Glucose: 91 mg/dL (ref 70–99)
Potassium: 3.9 mmol/L (ref 3.5–5.2)
Sodium: 139 mmol/L (ref 134–144)
Total Protein: 6.9 g/dL (ref 6.0–8.5)
eGFR: 67 mL/min/{1.73_m2} (ref >59)

## 2023-06-29 LAB — IRON,TIBC AND FERRITIN PANEL
Ferritin: 145 ng/mL (ref 30–400)
Iron Saturation: 19 % (ref 15–55)
Iron: 69 ug/dL (ref 38–169)
Total Iron Binding Capacity: 370 ug/dL (ref 250–450)
UIBC: 301 ug/dL (ref 111–343)

## 2023-06-29 LAB — TESTOSTERONE: Testosterone: 291 ng/dL (ref 264–916)

## 2023-06-30 ENCOUNTER — Encounter: Payer: Self-pay | Admitting: Family Medicine

## 2023-07-06 ENCOUNTER — Ambulatory Visit: Payer: 59 | Admitting: Family Medicine

## 2023-07-06 NOTE — Progress Notes (Deleted)
   Established Patient Office Visit  Subjective   Patient ID: Lance Long, male    DOB: 1966/04/26  Age: 58 y.o. MRN: [id number]  No chief complaint on file.   HPI  Low testosterone   The 10-year ASCVD risk score (Arnett DK, et al., 2019) is: 19.3%  Health Maintenance Due  Topic Date Due   COVID-19 Vaccine (3 - Pfizer risk series) 01/26/2020   INFLUENZA VACCINE  12/01/2022      Objective:     There were no vitals taken for this visit. {Vitals History (Optional):23777}  Physical Exam   No results found for any visits on 07/06/23.      Assessment & Plan:   There are no diagnoses linked to this encounter.   No follow-ups on file.    Sandre Kitty, MD

## 2023-08-16 ENCOUNTER — Encounter: Payer: Self-pay | Admitting: Family Medicine

## 2023-08-16 ENCOUNTER — Ambulatory Visit (INDEPENDENT_AMBULATORY_CARE_PROVIDER_SITE_OTHER): Admitting: Family Medicine

## 2023-08-16 VITALS — BP 127/77 | HR 73 | Ht 70.0 in | Wt 220.4 lb

## 2023-08-16 DIAGNOSIS — R7989 Other specified abnormal findings of blood chemistry: Secondary | ICD-10-CM

## 2023-08-16 DIAGNOSIS — N529 Male erectile dysfunction, unspecified: Secondary | ICD-10-CM | POA: Diagnosis not present

## 2023-08-16 MED ORDER — TESTOSTERONE 20.25 MG/ACT (1.62%) TD GEL: 2.0000 | Freq: Every day | TRANSDERMAL | 1 refills | Status: DC

## 2023-08-16 NOTE — Progress Notes (Signed)
   Established Patient Office Visit  Subjective   Patient ID: Lance Long, male    DOB: [birth year]  Age: 58 y.o. MRN: [id number]  Chief Complaint  Patient presents with   Medical Management of Chronic Issues    HPI Patient would like to discuss things cholesterol levels, and follow-up lab work.  Low testosterone -patient had 2 testosterone  levels less than 300.  Main symptom of low testosterone  is his decreased libido and erectile dysfunction.  Patient was taking Cialis  for about a week and a half before he started having bodyaches all over.  These resolved after he stopped taking the medication.  HLD-we discussed the patient's cholesterol test that showed mild elevation in LDL, normal LPA.  The 10-year ASCVD risk score (Arnett DK, et al., 2019) is: 11.9%  Health Maintenance Due  Topic Date Due   COVID-19 Vaccine (3 - Pfizer risk series) 01/26/2020      Objective:     BP 127/77   Pulse 73   Ht 5\' 10"  (1.778 m)   Wt 220 lb 6.4 oz (100 kg)   SpO2 98%   BMI 31.62 kg/m    Physical Exam General: Alert, oriented Pulmonary: Lungs clear bilaterally Psych: Pleasant affect   No results found for any visits on 08/16/23.      Assessment & Plan:   Low testosterone  in male Assessment & Plan: Prescribing testosterone  gel.  Had 2 readings both less than 300.  Recheck in 1 month  Orders: -     Testosterone ; Future  Erectile dysfunction, unspecified erectile dysfunction type Assessment & Plan: Had body aches with daily Cialis .  Wants to know if he can take it as needed.  We discussed doing this versus prescribing sildenafil .  Patient would like to try as needed tadalafil .   Other orders -     Testosterone ; Place 2 Act onto the skin daily. Apply to shoulders or abdomen  Dispense: 75 g; Refill: 1     Return in about 6 months (around 02/15/2024) for hld, HTN.    Laneta Pintos, MD

## 2023-08-16 NOTE — Patient Instructions (Signed)
 It was nice to see you today,  We addressed the following topics today: -I am prescribing testosterone gel.  You will use this by applying 2 pumps to either your shoulders or abdomen daily at the same time each day.  Wash your hands immediately before and after using this - There is some information regarding this medication in this handout - In 1 months we will need to recheck your testosterone levels.  We can recheck your cholesterol levels at that time - In 6 months we will follow-up in the office - For your tadalafil, you can use the 5 mg as needed instead of every day.  If you use it as needed you can take up to 10 mg at a time as long as you do not take this dose more than every other day.  Have a great day,  Etha Henle, MD

## 2023-08-22 DIAGNOSIS — R7989 Other specified abnormal findings of blood chemistry: Secondary | ICD-10-CM | POA: Insufficient documentation

## 2023-08-22 NOTE — Assessment & Plan Note (Signed)
 Had body aches with daily Cialis .  Wants to know if he can take it as needed.  We discussed doing this versus prescribing sildenafil .  Patient would like to try as needed tadalafil .

## 2023-08-22 NOTE — Assessment & Plan Note (Signed)
 Prescribing testosterone  gel.  Had 2 readings both less than 300.  Recheck in 1 month

## 2023-09-20 ENCOUNTER — Other Ambulatory Visit

## 2024-02-02 ENCOUNTER — Other Ambulatory Visit: Payer: Self-pay | Admitting: Family Medicine

## 2024-02-02 DIAGNOSIS — R7989 Other specified abnormal findings of blood chemistry: Secondary | ICD-10-CM

## 2024-02-02 DIAGNOSIS — I10 Essential (primary) hypertension: Secondary | ICD-10-CM

## 2024-02-02 DIAGNOSIS — E7800 Pure hypercholesterolemia, unspecified: Secondary | ICD-10-CM

## 2024-02-08 ENCOUNTER — Other Ambulatory Visit

## 2024-02-15 ENCOUNTER — Ambulatory Visit: Admitting: Family Medicine

## 2024-03-20 ENCOUNTER — Other Ambulatory Visit

## 2024-03-21 ENCOUNTER — Ambulatory Visit: Payer: Self-pay | Admitting: Family Medicine

## 2024-03-21 LAB — CBC WITH DIFFERENTIAL/PLATELET
Basophils Absolute: 0 x10E3/uL (ref 0.0–0.2)
Basos: 1 %
EOS (ABSOLUTE): 0.1 x10E3/uL (ref 0.0–0.4)
Eos: 1 %
Hematocrit: 47.5 % (ref 37.5–51.0)
Hemoglobin: 15.9 g/dL (ref 13.0–17.7)
Immature Grans (Abs): 0 x10E3/uL (ref 0.0–0.1)
Immature Granulocytes: 0 %
Lymphocytes Absolute: 2.4 x10E3/uL (ref 0.7–3.1)
Lymphs: 37 %
MCH: 29.5 pg (ref 26.6–33.0)
MCHC: 33.5 g/dL (ref 31.5–35.7)
MCV: 88 fL (ref 79–97)
Monocytes Absolute: 0.6 x10E3/uL (ref 0.1–0.9)
Monocytes: 10 %
Neutrophils Absolute: 3.3 x10E3/uL (ref 1.4–7.0)
Neutrophils: 51 %
Platelets: 246 x10E3/uL (ref 150–450)
RBC: 5.39 x10E6/uL (ref 4.14–5.80)
RDW: 12.9 % (ref 11.6–15.4)
WBC: 6.5 x10E3/uL (ref 3.4–10.8)

## 2024-03-21 LAB — LIPID PANEL
Chol/HDL Ratio: 3.9 ratio (ref 0.0–5.0)
Cholesterol, Total: 179 mg/dL (ref 100–199)
HDL: 46 mg/dL (ref >39)
LDL Chol Calc (NIH): 116 mg/dL — ABNORMAL HIGH (ref 0–99)
Triglycerides: 94 mg/dL (ref 0–149)
VLDL Cholesterol Cal: 17 mg/dL (ref 5–40)

## 2024-03-21 LAB — COMPREHENSIVE METABOLIC PANEL WITH GFR
ALT: 28 IU/L (ref 0–44)
AST: 29 IU/L (ref 0–40)
Albumin: 4.3 g/dL (ref 3.8–4.9)
Alkaline Phosphatase: 83 IU/L (ref 47–123)
BUN/Creatinine Ratio: 8 — ABNORMAL LOW (ref 9–20)
BUN: 9 mg/dL (ref 6–24)
Bilirubin Total: 0.6 mg/dL (ref 0.0–1.2)
CO2: 24 mmol/L (ref 20–29)
Calcium: 9.3 mg/dL (ref 8.7–10.2)
Chloride: 99 mmol/L (ref 96–106)
Creatinine, Ser: 1.14 mg/dL (ref 0.76–1.27)
Globulin, Total: 3.1 g/dL (ref 1.5–4.5)
Glucose: 100 mg/dL — ABNORMAL HIGH (ref 70–99)
Potassium: 4.1 mmol/L (ref 3.5–5.2)
Sodium: 138 mmol/L (ref 134–144)
Total Protein: 7.4 g/dL (ref 6.0–8.5)
eGFR: 75 mL/min/1.73 (ref >59)

## 2024-03-21 LAB — TESTOSTERONE: Testosterone: 328 ng/dL (ref 264–916)

## 2024-03-27 ENCOUNTER — Ambulatory Visit (INDEPENDENT_AMBULATORY_CARE_PROVIDER_SITE_OTHER): Admitting: Family Medicine

## 2024-03-27 ENCOUNTER — Encounter: Payer: Self-pay | Admitting: Family Medicine

## 2024-03-27 VITALS — BP 99/69 | HR 86 | Ht 70.0 in | Wt 212.8 lb

## 2024-03-27 DIAGNOSIS — E7800 Pure hypercholesterolemia, unspecified: Secondary | ICD-10-CM

## 2024-03-27 DIAGNOSIS — R7989 Other specified abnormal findings of blood chemistry: Secondary | ICD-10-CM | POA: Diagnosis not present

## 2024-03-27 DIAGNOSIS — G4733 Obstructive sleep apnea (adult) (pediatric): Secondary | ICD-10-CM | POA: Diagnosis not present

## 2024-03-27 DIAGNOSIS — L910 Hypertrophic scar: Secondary | ICD-10-CM

## 2024-03-27 MED ORDER — ROSUVASTATIN CALCIUM 10 MG PO TABS
10.0000 mg | ORAL_TABLET | ORAL | 3 refills | Status: AC
Start: 2024-03-27 — End: ?

## 2024-03-27 MED ORDER — TESTOSTERONE 20.25 MG/ACT (1.62%) TD GEL: 2.0000 | Freq: Every day | TRANSDERMAL | 5 refills | Status: AC

## 2024-03-27 NOTE — Patient Instructions (Signed)
 It was nice to see you today,  We addressed the following topics today: - Please start using your CPAP machine every night. If you need help, you can bring it to the clinic and we can show you how to use it. - I am sending a new prescription for Testosterone  cream. I would like you to start using two pumps every day. - I am sending a new prescription for a cholesterol medication called Crestor . Please take it once a week. - You can call your insurance company to ask if Zepbound is covered for the diagnosis of sleep apnea. If it is, please let us  know. - We will need to check your cholesterol and testosterone  levels in about 2-3 months. - Your next appointment will be your physical in April.  Have a great day,  Rolan Slain, MD

## 2024-03-27 NOTE — Assessment & Plan Note (Signed)
 Patient has a diagnosis of moderate OSA based on a 2019 home sleep study (AHI 17). Reports not using prescribed CPAP machine. Fatigue is likely secondary to untreated OSA. - Strongly encouraged to begin using CPAP machine nightly. - Offered in-office assistance to learn how to use the device if needed. - Discussed Zepbound as a treatment option for OSA, which works via weight loss. Advised patient to check with their private insurance if Zepbound is covered for the indication of sleep apnea.

## 2024-03-27 NOTE — Assessment & Plan Note (Signed)
 Patient discontinued first-line statin (Lipitor) due to side effects. Overall cholesterol is borderline elevated at 116. Given risk profile, treatment is still indicated. - Start Crestor  5 mg once weekly. - Recheck lipid panel in 2-3 months.

## 2024-03-27 NOTE — Assessment & Plan Note (Signed)
 Patient presents with ongoing fatigue. Testosterone  level on current therapy was only mildly increased. Will increase dose to improve energy levels. - Increase Testosterone  cream to 2 pumps daily. - Recheck testosterone  level in 2-3 months.

## 2024-03-27 NOTE — Progress Notes (Signed)
 Established Patient Office Visit  Subjective   Patient ID: Lance Long, male    DOB: [birth year]  Age: 58 y.o. MRN: [id number]  Chief Complaint  Patient presents with   Medical Management of Chronic Issues    HPI   Subjective - Follow-up for hypertension, hypercholesterolemia, and fatigue. - Reports persistent fatigue, described as a lack of energy. No daytime somnolence. Reports sleep is good, from approximately 10:30 PM to 6:00 AM. Reports history of snoring. - Reports stopping cholesterol medication due to feeling jitters. This was the first cholesterol medication tried. - Reports an itchy lesion on the chest.  Medications Testosterone  cream, currently using one pump daily. Reports stopping an unspecified cholesterol medication (Lipitor) due to side effects.  PMH, PSH, FH, Social Hx PMHx: Moderate obstructive sleep apnea (home sleep study in 2019 with AHI of 17), hypercholesterolemia, hypertension. Has a CPAP machine but is not currently using it. Labs earlier this year for fatigue (testosterone , iron, ferritin, thyroid) were reportedly normal except for testosterone . Has a history of hypertrophic scarring.  ROS Constitutional: Positive for fatigue. Negative for daytime somnolence. Integumentary: Positive for an itchy lesion on the chest.   The 10-year ASCVD risk score (Arnett DK, et al., 2019) is: 7.4%  Health Maintenance Due  Topic Date Due   Hepatitis B Vaccines 19-59 Average Risk (1 of 3 - 19+ 3-dose series) Never done   Pneumococcal Vaccine: 50+ Years (1 of 1 - PCV) Never done   COVID-19 Vaccine (3 - 2025-26 season) 01/01/2024      Objective:     BP 99/69   Pulse 86   Ht 5' 10 (1.778 m)   Wt 212 lb 12.8 oz (96.5 kg)   SpO2 99%   BMI 30.53 kg/m    Physical Exam Gen: alert, oriented Pulm: no respiratory distress SKIN: Hypertrophic scar noted on the chest. Another smaller scar noted just inferior to it. Psych: pleasant affect   No results found  for any visits on 03/27/24.      Assessment & Plan:   Elevated LDL cholesterol level Assessment & Plan: Patient discontinued first-line statin (Lipitor) due to side effects. Overall cholesterol is borderline elevated at 116. Given risk profile, treatment is still indicated. - Start Crestor  5 mg once weekly. - Recheck lipid panel in 2-3 months.   OSA (obstructive sleep apnea) Assessment & Plan: Patient has a diagnosis of moderate OSA based on a 2019 home sleep study (AHI 17). Reports not using prescribed CPAP machine. Fatigue is likely secondary to untreated OSA. - Strongly encouraged to begin using CPAP machine nightly. - Offered in-office assistance to learn how to use the device if needed. - Discussed Zepbound as a treatment option for OSA, which works via weight loss. Advised patient to check with their private insurance if Zepbound is covered for the indication of sleep apnea.   Low testosterone  in male Assessment & Plan: Patient presents with ongoing fatigue. Testosterone  level on current therapy was only mildly increased. Will increase dose to improve energy levels. - Increase Testosterone  cream to 2 pumps daily. - Recheck testosterone  level in 2-3 months.   Hypertrophic scar of skin  Other orders -     Testosterone ; Place 2 Act onto the skin daily. Apply to shoulders or abdomen  Dispense: 75 g; Refill: 5 -     Rosuvastatin  Calcium ; Take 1 tablet (10 mg total) by mouth once a week.  Dispense: 30 tablet; Refill: 3  Hypertrophic Scar Patient has an itchy hypertrophic scar on  the chest. This is likely due to a predisposition to this type of scarring. Explained that surgical removal often leads to recurrence. No treatment indicated at this time. - Continue to monitor.   Return in about 5 months (around 08/25/2024) for physical.    Toribio MARLA Slain, MD

## 2024-04-03 ENCOUNTER — Ambulatory Visit: Admitting: Family Medicine

## 2024-04-28 ENCOUNTER — Other Ambulatory Visit: Payer: Self-pay | Admitting: Family Medicine

## 2024-04-28 DIAGNOSIS — I10 Essential (primary) hypertension: Secondary | ICD-10-CM

## 2024-06-27 ENCOUNTER — Other Ambulatory Visit

## 2024-08-28 ENCOUNTER — Other Ambulatory Visit

## 2024-09-04 ENCOUNTER — Encounter: Admitting: Family Medicine
